# Patient Record
Sex: Female | Born: 1955 | Race: Black or African American | Hispanic: No | Marital: Single | Smoking: Never smoker
Health system: Southern US, Community
[De-identification: ages and names within clinical notes are randomized; demographics above are authoritative.]

## PROBLEM LIST (undated history)

## (undated) DIAGNOSIS — R011 Cardiac murmur, unspecified: Secondary | ICD-10-CM

## (undated) DIAGNOSIS — I10 Essential (primary) hypertension: Secondary | ICD-10-CM

## (undated) DIAGNOSIS — M1990 Unspecified osteoarthritis, unspecified site: Secondary | ICD-10-CM

## (undated) DIAGNOSIS — J302 Other seasonal allergic rhinitis: Secondary | ICD-10-CM

## (undated) DIAGNOSIS — R7611 Nonspecific reaction to tuberculin skin test without active tuberculosis: Secondary | ICD-10-CM

## (undated) DIAGNOSIS — G4730 Sleep apnea, unspecified: Secondary | ICD-10-CM

## (undated) DIAGNOSIS — R7303 Prediabetes: Secondary | ICD-10-CM

## (undated) DIAGNOSIS — E669 Obesity, unspecified: Secondary | ICD-10-CM

## (undated) HISTORY — PX: KNEE ARTHROSCOPY: SUR90

## (undated) HISTORY — PX: SHOULDER ARTHROSCOPY: SHX128

## (undated) HISTORY — PX: CARDIAC CATHETERIZATION: SHX172

## (undated) HISTORY — DX: Nonspecific reaction to tuberculin skin test without active tuberculosis: R76.11

## (undated) HISTORY — PX: COLONOSCOPY: SHX174

## (undated) HISTORY — DX: Obesity, unspecified: E66.9

---

## 1997-02-01 HISTORY — PX: ABDOMINAL HYSTERECTOMY: SHX81

## 1997-09-24 ENCOUNTER — Ambulatory Visit: Admission: RE | Admit: 1997-09-24 | Discharge: 1997-09-24 | Payer: Self-pay | Admitting: General Surgery

## 1997-09-27 ENCOUNTER — Encounter: Payer: Self-pay | Admitting: General Surgery

## 1997-11-03 ENCOUNTER — Ambulatory Visit (HOSPITAL_COMMUNITY): Admission: RE | Admit: 1997-11-03 | Discharge: 1997-11-03 | Payer: Self-pay | Admitting: *Deleted

## 1998-01-26 ENCOUNTER — Encounter: Payer: Self-pay | Admitting: Emergency Medicine

## 1998-01-26 ENCOUNTER — Emergency Department (HOSPITAL_COMMUNITY): Admission: EM | Admit: 1998-01-26 | Discharge: 1998-01-26 | Payer: Self-pay | Admitting: Emergency Medicine

## 2000-04-15 ENCOUNTER — Emergency Department (HOSPITAL_COMMUNITY): Admission: EM | Admit: 2000-04-15 | Discharge: 2000-04-15 | Payer: Self-pay | Admitting: Emergency Medicine

## 2000-04-15 ENCOUNTER — Encounter: Payer: Self-pay | Admitting: Emergency Medicine

## 2000-05-09 ENCOUNTER — Ambulatory Visit (HOSPITAL_BASED_OUTPATIENT_CLINIC_OR_DEPARTMENT_OTHER): Admission: RE | Admit: 2000-05-09 | Discharge: 2000-05-10 | Payer: Self-pay | Admitting: Orthopedic Surgery

## 2000-12-28 ENCOUNTER — Encounter: Admission: RE | Admit: 2000-12-28 | Discharge: 2001-03-28 | Payer: Self-pay | Admitting: Pediatrics

## 2003-02-02 LAB — HM COLONOSCOPY

## 2003-12-10 ENCOUNTER — Other Ambulatory Visit: Admission: RE | Admit: 2003-12-10 | Discharge: 2003-12-10 | Payer: Self-pay | Admitting: Obstetrics and Gynecology

## 2003-12-31 ENCOUNTER — Ambulatory Visit (HOSPITAL_COMMUNITY): Admission: RE | Admit: 2003-12-31 | Discharge: 2003-12-31 | Payer: Self-pay | Admitting: Obstetrics and Gynecology

## 2004-01-07 ENCOUNTER — Encounter: Admission: RE | Admit: 2004-01-07 | Discharge: 2004-01-07 | Payer: Self-pay | Admitting: Obstetrics and Gynecology

## 2005-02-24 ENCOUNTER — Emergency Department (HOSPITAL_COMMUNITY): Admission: EM | Admit: 2005-02-24 | Discharge: 2005-02-24 | Payer: Self-pay | Admitting: Emergency Medicine

## 2005-04-01 ENCOUNTER — Encounter: Admission: RE | Admit: 2005-04-01 | Discharge: 2005-06-30 | Payer: Self-pay | Admitting: Orthopedic Surgery

## 2005-06-02 ENCOUNTER — Encounter: Payer: Self-pay | Admitting: *Deleted

## 2005-06-03 ENCOUNTER — Ambulatory Visit (HOSPITAL_COMMUNITY): Admission: RE | Admit: 2005-06-03 | Discharge: 2005-06-03 | Payer: Self-pay | Admitting: Cardiology

## 2006-05-05 ENCOUNTER — Inpatient Hospital Stay (HOSPITAL_COMMUNITY): Admission: RE | Admit: 2006-05-05 | Discharge: 2006-05-09 | Payer: Self-pay | Admitting: Orthopedic Surgery

## 2006-05-30 ENCOUNTER — Encounter: Admission: RE | Admit: 2006-05-30 | Discharge: 2006-08-09 | Payer: Self-pay | Admitting: Orthopedic Surgery

## 2006-07-13 ENCOUNTER — Encounter: Admission: RE | Admit: 2006-07-13 | Discharge: 2006-07-13 | Payer: Self-pay | Admitting: Cardiology

## 2006-08-26 ENCOUNTER — Ambulatory Visit (HOSPITAL_BASED_OUTPATIENT_CLINIC_OR_DEPARTMENT_OTHER): Admission: RE | Admit: 2006-08-26 | Discharge: 2006-08-26 | Payer: Self-pay | Admitting: Cardiology

## 2006-08-28 ENCOUNTER — Ambulatory Visit: Payer: Self-pay | Admitting: Internal Medicine

## 2006-11-09 ENCOUNTER — Encounter: Admission: RE | Admit: 2006-11-09 | Discharge: 2006-11-09 | Payer: Self-pay | Admitting: Gastroenterology

## 2007-09-21 ENCOUNTER — Ambulatory Visit (HOSPITAL_COMMUNITY): Admission: RE | Admit: 2007-09-21 | Discharge: 2007-09-21 | Payer: Self-pay | Admitting: Cardiology

## 2007-11-15 ENCOUNTER — Emergency Department (HOSPITAL_COMMUNITY): Admission: EM | Admit: 2007-11-15 | Discharge: 2007-11-15 | Payer: Self-pay | Admitting: Emergency Medicine

## 2009-02-04 ENCOUNTER — Ambulatory Visit (HOSPITAL_COMMUNITY): Admission: RE | Admit: 2009-02-04 | Discharge: 2009-02-04 | Payer: Self-pay | Admitting: Cardiology

## 2010-06-16 NOTE — Procedures (Signed)
NAME:  Katelyn [name], Katelyn [name]            ACCOUNT NO.:  [id number]   MEDICAL RECORD NO.:  [id number]          PATIENT TYPE:  OUT   LOCATION:  SLEEP CENTER                 FACILITY:  [facility]   PHYSICIAN:  Clinton D. Maple Hudson, MD, FCCP, FACPDATE OF BIRTH:  03-18-54   DATE OF STUDY:  08/26/2006                            NOCTURNAL POLYSOMNOGRAM   REFERRING PHYSICIAN:   REFERRING PHYSICIAN:  Osvaldo Shipper. Spruill MD   INDICATION FOR STUDY:  Hypersomnia with sleep apnea.   EPWORTH SLEEPINESS SCORE:  14/24   BMI 36.8, weight 216 pounds.   HOME MEDICATION:  Listed and reviewed.   SLEEP ARCHITECTURE:  Total sleep time 310 minutes with sleep efficiency  79%.  Stage 1 was 16%; stage 2, 61%; stage 3 absent; REM 22% of total  sleep time.  Sleep latency 22 minutes, REM latency 92 minutes, awake  after sleep onset 63 minutes, arousal index 11.2.  No bedtime medication  was taken.   RESPIRATORY DATA:  Apnea/hypopnea index (AHI, RDI) 15.7 obstructive  events per hour, indicating mild to moderate obstructive sleep  apnea/hypopnea syndrome.  All events were hypopneas with a total of 81  recorded.  Events were not positional.  REM AHI 60 per hour.  There were  insufficient events to  meet protocol criteria for split CPAP titration  on this study night.   OXYGEN DATA:  Moderate snoring with oxygen desaturation to a nadir of  79%.  Mean oxygen saturation through the study was 94% on room air.   CARDIAC DATA:  Sinus rhythm with occasional PVC.   MOVEMENT/PARASOMNIA:  Rare limb jerk, insignificant.  Bathroom x1.   IMPRESSION/RECOMMENDATION:  1. Mild to moderate obstructive sleep apnea/hypopnea syndrome,      apnea/hypopnea index 15.7 per hour.  Events were all hypopneas,      nonpositional.  Moderate to loud snoring with oxygen desaturation      to a nadir of 79%.  2. There were insufficient early events to permit CPAP titration by      split protocol on this study night.  The patient would be a  candidate to return for CPAP titration.  Otherwise, consider for      alternative therapies as appropriate.      Clinton D. Maple Hudson, MD, [facility], FACP  Diplomate, Biomedical engineer of Sleep Medicine  Electronically Signed     CDY/MEDQ  D:  08/28/2006 12:26:38  T:  08/29/2006 08:57:48  Job:  [id number]

## 2010-06-19 NOTE — Discharge Summary (Signed)
[name], [name]            ACCOUNT NO.:  [id number]   MEDICAL RECORD NO.:  [id number]          PATIENT TYPE:  INP   LOCATION:  1507                         FACILITY:  [facility]   PHYSICIAN:  John L. Rendall, M.D.  DATE OF BIRTH:  1955-05-05   DATE OF ADMISSION:  05/05/2006  DATE OF DISCHARGE:  05/09/2006                               DISCHARGE SUMMARY   ADMISSION DIAGNOSIS:  Osteoarthritis of the right knee.   DISCHARGE DIAGNOSES:  1. Osteoarthritis of the right knee.  2. Osteoarthritis of the left knee.  3. History of hypertension.  4. Hypokalemia.  5. Exogenous obesity.   PROCEDURE:  Right total knee arthroplasty.   HISTORY:  Katelyn [name] is a very pleasant 55 year old African American  female with a history of osteoarthritis of both knees, right greater  than left.  Right knee has constant moderate to severe sharp, stabbing,  and aching pain.  Her symptoms are worsening right now where she is  having difficulties with her activities of daily living.  Radiographically, the patient has medial joint space narrowing.  She is  now indicated for right total knee arthroplasty.   HOSPITAL COURSE:  A 55 year old African American female admitted May 05, 2006. After appropriate laboratory studies were obtained, she was  taken to the operating room, where she underwent a right LCS total knee  arthroplasty with computer navigation by Dr. Priscille Kluver, assisted by Legrand Pitts. Duffy, P.A.-C.  She tolerated the procedure well.  She was placed  preoperatively on vancomycin 1 g IV on call to the operating room and  postoperatively was placed on clindamycin 600 mg IV q.8 h. x3 doses.  She was begun on Arixtra 2.5 mg subcutaneously q.8 p.m. for 7 days.  She  has a consultation with PT, OT, and care management.  Allowed to be  weightbearing as tolerated on the right.  A Foley was placed  intraoperatively.  CPM 0-90 degrees for 6-8 hours per day.  She was  allowed out of bed to a chair the  following day.  Her Foley was  discontinued on May 06, 2006.  Home health PT, CPM, and rolling walker  were ordered.  Her dressing was changed on postop day 2, and the wound  remained benign throughout the hospital course.  Her IV was discontinued  on May 07, 2006.  On May 08, 2006, she did have a Dulcolax  suppository.  Her potassium went down to 3, and she was placed on K-Dur  40 mg p.o. b.i.d. for one day.  Repeat potassium on May 09, 2006  revealed 2.9.  At that time, she was ordered 40 mEq p.o. b.i.d. today,  on the day of her discharge.  Potassium will be drawn at 1 p.m.  If it  is improving, then we will discharge.   EKG from November 02, 2005 from a physician's office with Dr. Shana Chute  appeared to be normal sinus rhythm.   RADIOGRAPHIC STUDIES:  Chest x-ray of May 02, 2006 revealed mild  thoracic spondylosis and no acute findings.   LABORATORY STUDIES:  Admitted with hemoglobin 12.5, hematocrit 38%,  white  count 8900, and platelets 318,000.  Discharge hemoglobin 9.9,  hematocrit 28.6%, white count 14,000, and platelet count 262,000.  White  count was elevated at this maximum of 15,300 on May 07, 2006.  Chemistries:  Admitted with sodium 144, potassium 3.6, chloride 102, CO2  33, glucose 129, BUN 14, creatinine 1.02.  GFR was 57.  Total protein  7.4, albumin 3.2, AST 19, ALT 18, ALP 70, total bilirubin 1.1.  Discharge potassium was 2.9 at the time of this dictation.  Previously,  sodium was 136, potassium 3, chloride 99, CO2 30, glucose 167, BUN 8,  creatinine 0.94.  Urinalysis on May 02, 2006 revealed many  epithelials, 0-2 white cells, few bacteria, and hyaline casts were  noted.  Urine on May 05, 2006 was benign.  Urine culture on May 02, 2006 showed 50,000 colonies of Escherichia coli which was sensitive to  ceftriaxone, ciprofloxacin, gentamycin, levofloxacin, nitrofurantoin,  tobramycin, and Septra.  However, her catheterized urine on May 05, 2006, on the day  of her admission, revealed no bacteria.   DISCHARGE INSTRUCTIONS:  There are no restrictions on her diet, other  than to eat a banana a day.  She will increase her activity slowly.  Use  of crutches or a walker, weightbearing on the right as tolerated.  She  may bathe or shower.  No lifting for 6 weeks, no driving for 6 weeks.  CPM 0-90 degrees for 6-8 hours per day.  She will follow up per  instruction sheet.   PRESCRIPTIONS:  1. Arixtra 2.5 mg, inject 2.5 subcutaneously at 8 p.m., last dose on      May 11, 2006.  2. Percocet 5/325, 1-2 tabs q.4-6 h. as needed for pain.  3. Robaxin 500 mg 1 tab q.6 h. as needed for spasms.  4. Allegra 180 mg 1 tablet daily.  5. Diovan/HCTZ 160/25, 1 tab daily.  6. Multivitamin take 1 tab daily.  7. Aspirin 81 mg 1 tab daily starting May 12, 2006.  8. Mucinex take 1 tab b.i.d.  9. Robitussin-DM 1 tsp q.4 h. as needed.  10.She will stop her tramadol.   [facility] for her home health needs.  Return to see Dr. Priscille Kluver on  Thursday May 19, 2006.  Call for an appointment.  Discharged in  improved condition.      Oris Drone Petrarca, P.A.-C.      Carlisle Beers. Rendall, M.D.  Electronically Signed    BDP/MEDQ  D:  05/09/2006  T:  05/09/2006  Job:  [id number]

## 2010-06-19 NOTE — Op Note (Signed)
Katelyn [name], Katelyn [name]            ACCOUNT NO.:  [id number]   MEDICAL RECORD NO.:  [id number]          PATIENT TYPE:  INP   LOCATION:  NA                           FACILITY:  [facility]   PHYSICIAN:  John L. Rendall, M.D.  DATE OF BIRTH:  30-Apr-1955   DATE OF PROCEDURE:  05/05/2006  DATE OF DISCHARGE:                               OPERATIVE REPORT   PREOPERATIVE DIAGNOSIS:  Osteoarthritis with deformity, right knee.   SURGICAL PROCEDURE:  Right LCS total knee arthroplasty with computer  navigation assistance.   POSTOPERATIVE DIAGNOSIS:  Osteoarthritis with deformity, right knee.   SURGEON:  John L. Rendall, M.D.   ASSISTANT:  Legrand Pitts. Duffy, P.A.-C.   ANESTHESIA:  General.   PATHOLOGY:  The patient has a fixed varus deformity of 5.5 degrees with  flexion of 8 degrees at the start of the procedure according to the  computer.  She is 24, and anatomic alignment is strongly to be desired  in view of her young age and need to give longevity to the prosthesis.   PROCEDURE:  Under general anesthesia, the right leg is prepared with  DuraPrep.  A sterile tourniquet is applied.  The leg is wrapped out, and  the tourniquet is used at 350 mm.   A midline incision is made.  The femur is sized at a standard.  Debridement is carried out in preparation for computer mapping.  Two  Schanz pins are then placed through extra stab wounds in the proximal  medial tibia and then the distal medial femur.  The arrays are set up.  The femoral head is identified.  The medial and lateral malleoli are  identified.  The proximal tibia and distal femur are mapped.  Knee  extension is checked, and the deformity noted above is found.  At this  point, the first guide is used for proximal tibial resection 10 mm off  the high side.  The tensioner is then inserted, and the knee is still in  7 degrees of varus, and extensive release along the medial tibia is  done, taking down spurs.  Once this is completed, it is  down to 3.5  degrees of varus, and it is necessary to release the semimembranosus and  the posterior medial corner to get full extension to 0 degrees.  Once  this is done, with the tensioner in extension and flexion is checked, we  gone ahead and used the computer for the first cut on the femur,  anterior and posterior flare within 1 degree of anatomic alignment, and  then the distal femoral cut is made and the flexion and extension gaps  are balanced at 10 mm.  The laminar spreader is then inserted.  Remnants  of the menisci and spurs off the back of the femoral condyles are  removed.  Recessing guide is then inserted, and chamfer cuts are made.  Once this was completed, the proximal tibia is exposed.  Center peg hole  with keel is inserted.  Trial seating then of a 2.5 tibia, 10-mm  bearing, and standard femur reveal excellent fit, alignment, and  stability at 0 degrees  of varus at this point and full extension.  The  patella is then osteotomized and the three peg patella inserted.  Again,  with this in place we are anatomically aligned, and the computer  navigation arrays are taken down.  Permanent components are obtained.  They are then cemented in place after appropriate preparation of bony  surfaces with pulse irrigation.  Once the cement is hardened, the  tourniquet is let down at 1 hour and 12 minutes.  Multiple small vessels  are cauterized.  The knee is then closed in layers with #1 Tycron, 0  Vicryl, 2-0 Vicryl, and skin clips.  Operative time was approximately an  hour and a half.   The patient tolerated the procedure well and has an excellent stable  knee.  She returned to recovery in good condition.  Lucienne Minks Duffy P.A.-C.  was there throughout the entire case and participated in all parts of  the case.      John L. Rendall, M.D.  Electronically Signed     JLR/MEDQ  D:  05/05/2006  T:  05/05/2006  Job:  [id number]

## 2010-06-19 NOTE — Op Note (Signed)
[place]  Patient:    Katelyn [name], Katelyn [name]                   MRN: [id number] Proc. Date: 05/09/00 Adm. Date:  [id number] Attending:  Carolan Shiver Ii                           Operative Report  PREOPERATIVE DIAGNOSES: 1. Torn medial and lateral menisci, right knee. 2. Internal derangement with osteoarthritis, left knee.  PROCEDURES: 1. Right knee medial and lateral meniscectomy. 2. Patellofemoral chondroplasty, right knee. 3. Left knee medial meniscectomy. 4. Patellofemoral chondroplasty, left knee.  POSTOPERATIVE DIAGNOSES: 1. Degenerative tears medial and lateral menisci, right knee, with    patellofemoral arthritis. 2. Left knee degenerative tear medial meniscus, with patellofemoral arthritis.  SURGEON:  John L. Dorothyann Gibbs, M.D.  ANESTHESIA: General.  INDICATION AND JUSTIFICATION FOR PROCEDURE:  Chronic bilateral knee pain with MRI positive for torn medial and lateral menisci in the right knee two years ago and now with worsening symptoms in the left knee as well.  JUSTIFICATION FOR OVERNIGHT STAY:  Pain control and the fact that it is a bilateral procedure.  DESCRIPTION OF PROCEDURE:  Under general anesthesia, both legs were prepared with Duraprep and draped as a sterile field using leg holders and tourniquets proximal to the leg holders.  Right leg was wrapped out with an Esmarch first. Standard portals were used.  Diagnostic arthroscopy revealed complex posterior horn tear medial meniscus and grade 2 and 3 changes on the articular cartilage of the femoral condyle.  Using a basket forceps and a curved Merlin shaver, the meniscus was resected back to stable meniscal rim, and chondroplasty of the femoral condyle was performed.  Attention was then turned to the patellofemoral articulation, which through the whole patellofemoral groove shows peeling hyaline cartilage and rough edges and craters and fibrillation on the whole  inferior one-half of the patella.  Using curved Merlin shaver through both medial and lateral portals, patellar shaving is carried out. Attention is then turned to the lateral meniscus.  In the posterior horn near the intercondylar notch, there was a complex tear of the lateral meniscus. This was resected with a combination of basket forceps and intra-articular shaver, again through both medial and lateral portals for optimal resection. Once this was all completed, the knee was copiously irrigated with saline.  A minor synovitis was resected in the suprapatellar pouch.  The knee has one portal closed with 3-0 nylon, and it has been injected with Marcaine with epinephrine.  Attention is then turned to the left knee.  Right knee tourniquet time was 35 minutes.  The left knee is then wrapped out with an Esmarch, tourniquet used at 350 mm.  Standard portals are used.  A complex posterior horn tear of the medial meniscus is found, again with peeling cartilage off the femoral condyle.  Using a combination of basket forceps and intra-articular shaver, this was resected back to stable meniscal rim, and local chondroplasty is done.  Attention is then turned to the patella, where the entire center of the patellofemoral groove is peeling, and this is shaved with an intra-articular shaver through both medial and lateral portals for optimal smoothing.  The patella likewise is shaved on its inferior pole.  Once this is completed, attention is turned to the lateral meniscus, where there is only a minor abnormality of the anterior horn requiring minor shaving.  Once this  is completed, the knee is copiously irrigated with saline, infiltrated with Marcaine with morphine with epinephrine, and a sterile compression bandage is applied.  Tourniquet time left knee was 16 minutes. DD:  05/09/00 TD:  05/09/00 Job: [id number]

## 2010-06-25 ENCOUNTER — Other Ambulatory Visit (HOSPITAL_COMMUNITY): Payer: Self-pay | Admitting: Cardiology

## 2010-06-25 DIAGNOSIS — Z1231 Encounter for screening mammogram for malignant neoplasm of breast: Secondary | ICD-10-CM

## 2010-07-14 ENCOUNTER — Ambulatory Visit (HOSPITAL_COMMUNITY)
Admission: RE | Admit: 2010-07-14 | Discharge: 2010-07-14 | Disposition: A | Discharge status: Home / Self Care | Payer: BC Managed Care – PPO | Source: Ambulatory Visit | Attending: Cardiology | Admitting: Cardiology

## 2010-07-14 DIAGNOSIS — Z1231 Encounter for screening mammogram for malignant neoplasm of breast: Secondary | ICD-10-CM | POA: Insufficient documentation

## 2010-07-17 ENCOUNTER — Other Ambulatory Visit: Payer: Self-pay | Admitting: Cardiology

## 2010-07-17 DIAGNOSIS — R928 Other abnormal and inconclusive findings on diagnostic imaging of breast: Secondary | ICD-10-CM

## 2010-07-23 ENCOUNTER — Other Ambulatory Visit: Payer: BC Managed Care – PPO

## 2010-07-28 ENCOUNTER — Ambulatory Visit
Admission: RE | Admit: 2010-07-28 | Discharge: 2010-07-28 | Disposition: A | Discharge status: Home / Self Care | Payer: BC Managed Care – PPO | Source: Ambulatory Visit | Attending: Cardiology | Admitting: Cardiology

## 2010-07-28 DIAGNOSIS — R928 Other abnormal and inconclusive findings on diagnostic imaging of breast: Secondary | ICD-10-CM

## 2011-06-08 ENCOUNTER — Other Ambulatory Visit: Payer: Self-pay | Admitting: Cardiology

## 2011-09-21 ENCOUNTER — Other Ambulatory Visit (HOSPITAL_COMMUNITY): Payer: Self-pay | Admitting: Cardiology

## 2011-09-21 DIAGNOSIS — Z1231 Encounter for screening mammogram for malignant neoplasm of breast: Secondary | ICD-10-CM

## 2011-10-12 ENCOUNTER — Ambulatory Visit (HOSPITAL_COMMUNITY)
Admission: RE | Admit: 2011-10-12 | Discharge: 2011-10-12 | Disposition: A | Discharge status: Home / Self Care | Payer: 59 | Source: Ambulatory Visit | Attending: Cardiology | Admitting: Cardiology

## 2011-10-12 DIAGNOSIS — Z1231 Encounter for screening mammogram for malignant neoplasm of breast: Secondary | ICD-10-CM

## 2011-11-08 ENCOUNTER — Other Ambulatory Visit (HOSPITAL_COMMUNITY): Payer: Self-pay | Admitting: Orthopedic Surgery

## 2011-11-08 DIAGNOSIS — M25561 Pain in right knee: Secondary | ICD-10-CM

## 2011-11-11 ENCOUNTER — Encounter (HOSPITAL_COMMUNITY): Payer: Self-pay

## 2011-11-11 ENCOUNTER — Encounter (HOSPITAL_COMMUNITY)
Admission: RE | Admit: 2011-11-11 | Discharge: 2011-11-11 | Disposition: A | Discharge status: Home / Self Care | Payer: 59 | Source: Ambulatory Visit | Attending: Orthopedic Surgery | Admitting: Orthopedic Surgery

## 2011-11-11 DIAGNOSIS — M25561 Pain in right knee: Secondary | ICD-10-CM

## 2011-11-11 DIAGNOSIS — M25569 Pain in unspecified knee: Secondary | ICD-10-CM | POA: Insufficient documentation

## 2011-11-11 MED ORDER — TECHNETIUM TC 99M MEDRONATE IV KIT
25.0000 | PACK | Freq: Once | INTRAVENOUS | Status: AC | PRN
  Administered 2011-11-11: 25 via INTRAVENOUS

## 2011-11-25 ENCOUNTER — Other Ambulatory Visit (HOSPITAL_COMMUNITY): Payer: Self-pay | Admitting: Cardiology

## 2011-11-25 DIAGNOSIS — R079 Chest pain, unspecified: Secondary | ICD-10-CM

## 2011-12-06 ENCOUNTER — Encounter (HOSPITAL_COMMUNITY)
Admission: RE | Admit: 2011-12-06 | Discharge: 2011-12-06 | Disposition: A | Discharge status: Home / Self Care | Payer: 59 | Source: Ambulatory Visit | Attending: Cardiology | Admitting: Cardiology

## 2011-12-06 ENCOUNTER — Other Ambulatory Visit: Payer: Self-pay

## 2011-12-06 DIAGNOSIS — R079 Chest pain, unspecified: Secondary | ICD-10-CM | POA: Insufficient documentation

## 2011-12-06 LAB — LIPID PANEL
Cholesterol: 181 mg/dL (ref 0–200)
LDL Cholesterol: 102 mg/dL — ABNORMAL HIGH (ref 0–99)
Triglycerides: 80 mg/dL (ref <150)
VLDL: 16 mg/dL (ref 0–40)

## 2011-12-06 LAB — HEPATIC FUNCTION PANEL
Alkaline Phosphatase: 73 U/L (ref 39–117)
Bilirubin, Direct: 0.1 mg/dL (ref 0.0–0.3)
Indirect Bilirubin: 0.6 mg/dL (ref 0.3–0.9)
Total Protein: 7.8 g/dL (ref 6.0–8.3)

## 2011-12-06 LAB — BASIC METABOLIC PANEL: Glucose, Bld: 127 mg/dL — ABNORMAL HIGH (ref 70–99)

## 2011-12-06 LAB — HEMOGLOBIN A1C: Hgb A1c MFr Bld: 7 % — ABNORMAL HIGH (ref <5.7)

## 2011-12-06 MED ORDER — REGADENOSON 0.4 MG/5ML IV SOLN
0.4000 mg | Freq: Once | INTRAVENOUS | Status: AC
  Administered 2011-12-06: 0.4 mg via INTRAVENOUS

## 2011-12-06 MED ORDER — REGADENOSON 0.4 MG/5ML IV SOLN
INTRAVENOUS | Status: AC
  Filled 2011-12-06: qty 5

## 2011-12-06 MED ORDER — TECHNETIUM TC 99M SESTAMIBI GENERIC - CARDIOLITE
10.0000 | Freq: Once | INTRAVENOUS | Status: AC | PRN
  Administered 2011-12-06: 10 via INTRAVENOUS

## 2011-12-06 MED ORDER — TECHNETIUM TC 99M SESTAMIBI GENERIC - CARDIOLITE
30.0000 | Freq: Once | INTRAVENOUS | Status: AC | PRN
  Administered 2011-12-06: 30 via INTRAVENOUS

## 2011-12-14 ENCOUNTER — Encounter (HOSPITAL_BASED_OUTPATIENT_CLINIC_OR_DEPARTMENT_OTHER)
Admission: RE | Disposition: A | Discharge status: Home / Self Care | Payer: Self-pay | Source: Ambulatory Visit | Attending: Cardiology

## 2011-12-14 ENCOUNTER — Inpatient Hospital Stay (HOSPITAL_BASED_OUTPATIENT_CLINIC_OR_DEPARTMENT_OTHER)
Admission: RE | Admit: 2011-12-14 | Discharge: 2011-12-14 | Disposition: A | Discharge status: Home / Self Care | Payer: 59 | Source: Ambulatory Visit | Attending: Cardiology | Admitting: Cardiology

## 2011-12-14 DIAGNOSIS — R0609 Other forms of dyspnea: Secondary | ICD-10-CM | POA: Insufficient documentation

## 2011-12-14 DIAGNOSIS — E7800 Pure hypercholesterolemia, unspecified: Secondary | ICD-10-CM | POA: Insufficient documentation

## 2011-12-14 DIAGNOSIS — G4733 Obstructive sleep apnea (adult) (pediatric): Secondary | ICD-10-CM | POA: Insufficient documentation

## 2011-12-14 DIAGNOSIS — R9439 Abnormal result of other cardiovascular function study: Secondary | ICD-10-CM | POA: Insufficient documentation

## 2011-12-14 DIAGNOSIS — R0989 Other specified symptoms and signs involving the circulatory and respiratory systems: Secondary | ICD-10-CM | POA: Insufficient documentation

## 2011-12-14 DIAGNOSIS — M1990 Unspecified osteoarthritis, unspecified site: Secondary | ICD-10-CM | POA: Insufficient documentation

## 2011-12-14 DIAGNOSIS — I10 Essential (primary) hypertension: Secondary | ICD-10-CM | POA: Insufficient documentation

## 2011-12-14 DIAGNOSIS — E662 Morbid (severe) obesity with alveolar hypoventilation: Secondary | ICD-10-CM | POA: Insufficient documentation

## 2011-12-14 SURGERY — JV LEFT HEART CATHETERIZATION WITH CORONARY ANGIOGRAM
Anesthesia: Moderate Sedation

## 2011-12-14 MED ORDER — SODIUM CHLORIDE 0.9 % IV SOLN: INTRAVENOUS | Status: DC

## 2011-12-14 MED ORDER — ACETAMINOPHEN 325 MG PO TABS: 650.0000 mg | ORAL_TABLET | ORAL | Status: DC | PRN

## 2011-12-14 NOTE — OR Nursing (Signed)
Tegaderm dressing applied, site level 0, bedrest begins at 1030 

## 2011-12-14 NOTE — OR Nursing (Signed)
Discharge instructions reviewed and signed, pt stated understanding, ambulated in hall without difficulty, site level 0, transported to friend's car via wheelchair 

## 2011-12-14 NOTE — OR Nursing (Signed)
Meal served 

## 2011-12-14 NOTE — CV Procedure (Signed)
Cardiac cath report dictated on 12/14/2011 dictation number is [phone]

## 2011-12-14 NOTE — H&P (Signed)
  Handwritten H&P in the chart needs to be scanned. 

## 2011-12-15 NOTE — Cardiovascular Report (Signed)
NAMEYANAE, CRISSMAN            ACCOUNT NO.:  [id number]  MEDICAL RECORD NO.:  [id number]  LOCATION:                                 FACILITY:  PHYSICIAN:  Irisha Grandmaison N. Sharyn Lull, M.D. DATE OF BIRTH:  11-15-1955  DATE OF PROCEDURE:  12/14/2011 DATE OF DISCHARGE:                           CARDIAC CATHETERIZATION   PROCEDURE:  Left cardiac cath with selective left and right coronary angiography, LV graphy via right groin using Judkins technique.  INDICATION FOR THE PROCEDURE:  Ms. Johanns is 56 year old black female with past medical history significant for hypertension, glucose intolerance, obstructive sleep apnea/obesity hypoventilation syndrome, hypercholesteremia, morbid obesity, degenerative joint disease, complaints of exertional dyspnea associated with minimal exertion.  The patient denies any chest pain, nausea, vomiting, diaphoresis, although her activity is very limited due to knee problems and is scheduled for right total knee replacement.  The patient underwent Lexiscan Myoview on December 06, 2011 which showed small area of reversible ischemia in the anteroapical region with EF of 77%.  The patient denies any palpitation, lightheadedness, syncope.  Discussed with the patient regarding mildly abnormal Lexiscan Myoview and various options of treatment, i.e., medical versus invasive left cath, its risks and benefits, i.e., death, MI, stroke, need for emergency CABG, local vascular complications, etc., and consented for PCI procedure.  After obtaining the informed consent, the patient was brought to the cath lab and was placed on fluoroscopy table.  Right groin was prepped and draped in usual fashion.  Xylocaine 1% was used for local anesthesia in the right groin.  With the help of thin wall needle, a 4-French arterial sheath was placed.  The sheath was aspirated and flushed.  Next, 4-French left Judkins catheter was advanced over the wire under fluoroscopic guidance up to the  ascending aorta.  Wire was pulled out.  The catheter was aspirated and connected to the Manifold.  Catheter was further advanced and engaged into left coronary ostium.  Multiple views of the left system were taken.  Next, catheter was disengaged and was pulled out over the wire and was replaced with 4-French 3D right diagnostic catheter which was advanced over the wire under fluoroscopic guidance up to the ascending aorta. Wire was pulled out.  The catheter was aspirated and connected to the Manifold.  Catheter was further advanced and engaged into right coronary ostium.  Multiple views of the right system were taken.  Next, catheter was disengaged and was pulled out over the wire and was replaced with 4- [place] pigtail catheter which was advanced over the wire under fluoroscopic guidance up to the ascending aorta.  Wire was pulled out. The catheter was aspirated and connected to the Manifold.  Catheter was further advanced across the aortic valve into the LV.  LV pressures were recorded.  Next, LV graft was done in 30-degree RAO position. Postangiographic pressures were recorded from LV and then pullback pressures were recorded from the aorta.  There was no gradient across the aortic valve.  Next, the pigtail catheter was pulled out over the wire.  Sheaths were aspirated and flushed.  FINDINGS:  LV showed good LV systolic function.  Mild LVH.  Left main was short which was patent.  LAD has  10-15% proximal and mid stenosis. Diagonal 1 and 2 were very small which were patent.  Ramus was large which was patent.  Left circumflex was small which was patent.  RCA was patent.  PDA and PLV branches were small which were patent.  The patient tolerated procedure well.  There were no complications.  The patient was transferred to recovery room in stable condition.     Eduardo Osier. Sharyn Lull, M.D.     MNH/MEDQ  D:  12/14/2011  T:  12/15/2011  Job:  [id number]

## 2011-12-28 ENCOUNTER — Other Ambulatory Visit: Payer: Self-pay | Admitting: Orthopedic Surgery

## 2012-01-17 ENCOUNTER — Encounter (HOSPITAL_COMMUNITY): Admission: RE | Admit: 2012-01-17 | Payer: 59 | Source: Ambulatory Visit

## 2012-02-21 ENCOUNTER — Encounter (HOSPITAL_COMMUNITY): Payer: Self-pay | Admitting: Pharmacy Technician

## 2012-02-25 ENCOUNTER — Encounter (HOSPITAL_COMMUNITY): Payer: Self-pay

## 2012-02-25 ENCOUNTER — Other Ambulatory Visit: Payer: Self-pay | Admitting: Orthopedic Surgery

## 2012-02-25 ENCOUNTER — Encounter (HOSPITAL_COMMUNITY)
Admission: RE | Admit: 2012-02-25 | Discharge: 2012-02-25 | Disposition: A | Discharge status: Home / Self Care | Payer: 59 | Source: Ambulatory Visit | Attending: Orthopedic Surgery | Admitting: Orthopedic Surgery

## 2012-02-25 HISTORY — DX: Other seasonal allergic rhinitis: J30.2

## 2012-02-25 HISTORY — DX: Essential (primary) hypertension: I10

## 2012-02-25 HISTORY — DX: Cardiac murmur, unspecified: R01.1

## 2012-02-25 HISTORY — DX: Sleep apnea, unspecified: G47.30

## 2012-02-25 LAB — URINALYSIS, ROUTINE W REFLEX MICROSCOPIC
Bilirubin Urine: NEGATIVE
Nitrite: NEGATIVE
Specific Gravity, Urine: 1.022 (ref 1.005–1.030)
Urobilinogen, UA: 1 mg/dL (ref 0.0–1.0)
pH: 6.5 (ref 5.0–8.0)

## 2012-02-25 LAB — TYPE AND SCREEN

## 2012-02-25 LAB — PROTIME-INR
INR: 0.97 (ref 0.00–1.49)
Prothrombin Time: 12.8 seconds (ref 11.6–15.2)

## 2012-02-25 LAB — COMPREHENSIVE METABOLIC PANEL
AST: 15 U/L (ref 0–37)
Albumin: 3.1 g/dL — ABNORMAL LOW (ref 3.5–5.2)
BUN: 21 mg/dL (ref 6–23)
Chloride: 100 mEq/L (ref 96–112)
Creatinine, Ser: 1.01 mg/dL (ref 0.50–1.10)
Total Bilirubin: 0.5 mg/dL (ref 0.3–1.2)
Total Protein: 7.6 g/dL (ref 6.0–8.3)

## 2012-02-25 LAB — CBC WITH DIFFERENTIAL/PLATELET
Basophils Absolute: 0 10*3/uL (ref 0.0–0.1)
Basophils Relative: 0 % (ref 0–1)
Eosinophils Absolute: 0.2 10*3/uL (ref 0.0–0.7)
HCT: 39.2 % (ref 36.0–46.0)
MCH: 26.3 pg (ref 26.0–34.0)
MCHC: 33.7 g/dL (ref 30.0–36.0)
Monocytes Absolute: 0.9 10*3/uL (ref 0.1–1.0)
Neutro Abs: 4.2 10*3/uL (ref 1.7–7.7)
Neutrophils Relative %: 51 % (ref 43–77)
RDW: 15.8 % — ABNORMAL HIGH (ref 11.5–15.5)

## 2012-02-25 LAB — SURGICAL PCR SCREEN
MRSA, PCR: NEGATIVE
Staphylococcus aureus: NEGATIVE

## 2012-02-25 LAB — URINE MICROSCOPIC-ADD ON

## 2012-02-25 LAB — APTT: aPTT: 37 seconds (ref 24–37)

## 2012-02-25 MED ORDER — CHLORHEXIDINE GLUCONATE 4 % EX LIQD: 60.0000 mL | Freq: Once | CUTANEOUS | Status: DC

## 2012-02-28 ENCOUNTER — Encounter (HOSPITAL_COMMUNITY): Payer: Self-pay | Admitting: Vascular Surgery

## 2012-02-28 LAB — URINE CULTURE

## 2012-02-28 NOTE — Consult Note (Signed)
Anesthesia Chart Review:  Patient is a 57 year old female scheduled for right TK revision by Dr. Sherlean Foot on 03/06/12.  History includes non-smoker, morbid obesity (BMI 40), OSA, HTN, TB '97, arthritis, murmur (not specified), right TKA '08.  Patient underwent nuclear stress test on 12/06/11 that showed mild anteroapical ischemia without wall motion abnormality, LVEF 77%.  She subsequently underwent cardiac cath on 12/14/11 (Dr. Sharyn Lull) that showed: LV showed good LV systolic function. Mild LVH. Left main was short which was patent. LAD has 10-15% proximal and mid stenosis. Diagonal 1 and 2 were very small which were patent. Ramus was large which was patent. Left circumflex was small which was patent. RCA was patent. PDA and PLV branches were small which were patent.  EKG on 02/25/12 showed NSR, LAD.    CXR on 02/25/12 showed new cardiomegaly, low volumes with bibasilar subseqmental atelectasis.  Pre-operative labs noted.  Labs faxed to Dr. Tobin Chad office with confirmation.  She had minimal CAD and normal EF by recent cath.  If no significant changes then would anticipate she could proceed from an anesthesia standpoint.  Shonna Chock, PA-C 02/28/12 1759

## 2012-03-03 NOTE — Progress Notes (Signed)
Patient knows to arrive at [address]

## 2012-03-05 MED ORDER — VANCOMYCIN HCL 10 G IV SOLR
1500.0000 mg | INTRAVENOUS | Status: DC
  Filled 2012-03-05: qty 1500

## 2012-03-06 ENCOUNTER — Encounter (HOSPITAL_COMMUNITY): Payer: Self-pay | Admitting: *Deleted

## 2012-03-06 ENCOUNTER — Encounter (HOSPITAL_COMMUNITY): Payer: Self-pay | Admitting: Vascular Surgery

## 2012-03-06 ENCOUNTER — Inpatient Hospital Stay (HOSPITAL_COMMUNITY): Payer: 59

## 2012-03-06 ENCOUNTER — Inpatient Hospital Stay (HOSPITAL_COMMUNITY)
Admission: RE | Admit: 2012-03-06 | Discharge: 2012-03-10 | DRG: 467 | Disposition: A | Discharge status: Skilled Nursing Facility | Payer: 59 | Source: Ambulatory Visit | Attending: Orthopedic Surgery | Admitting: Orthopedic Surgery

## 2012-03-06 ENCOUNTER — Inpatient Hospital Stay (HOSPITAL_COMMUNITY): Payer: 59 | Admitting: Vascular Surgery

## 2012-03-06 ENCOUNTER — Encounter (HOSPITAL_COMMUNITY)
Admission: RE | Disposition: A | Discharge status: Skilled Nursing Facility | Payer: Self-pay | Source: Ambulatory Visit | Attending: Orthopedic Surgery

## 2012-03-06 DIAGNOSIS — Z01818 Encounter for other preprocedural examination: Secondary | ICD-10-CM

## 2012-03-06 DIAGNOSIS — M129 Arthropathy, unspecified: Secondary | ICD-10-CM | POA: Diagnosis present

## 2012-03-06 DIAGNOSIS — Z8611 Personal history of tuberculosis: Secondary | ICD-10-CM

## 2012-03-06 DIAGNOSIS — Y831 Surgical operation with implant of artificial internal device as the cause of abnormal reaction of the patient, or of later complication, without mention of misadventure at the time of the procedure: Secondary | ICD-10-CM | POA: Diagnosis present

## 2012-03-06 DIAGNOSIS — T8489XA Other specified complication of internal orthopedic prosthetic devices, implants and grafts, initial encounter: Principal | ICD-10-CM | POA: Diagnosis present

## 2012-03-06 DIAGNOSIS — J309 Allergic rhinitis, unspecified: Secondary | ICD-10-CM | POA: Diagnosis present

## 2012-03-06 DIAGNOSIS — M25569 Pain in unspecified knee: Secondary | ICD-10-CM

## 2012-03-06 DIAGNOSIS — D62 Acute posthemorrhagic anemia: Secondary | ICD-10-CM | POA: Diagnosis not present

## 2012-03-06 DIAGNOSIS — Z01812 Encounter for preprocedural laboratory examination: Secondary | ICD-10-CM

## 2012-03-06 DIAGNOSIS — I10 Essential (primary) hypertension: Secondary | ICD-10-CM | POA: Diagnosis present

## 2012-03-06 DIAGNOSIS — Z96659 Presence of unspecified artificial knee joint: Secondary | ICD-10-CM

## 2012-03-06 HISTORY — PX: TOTAL KNEE REVISION: SHX996

## 2012-03-06 LAB — CBC
Hemoglobin: 12.7 g/dL (ref 12.0–15.0)
MCH: 25.3 pg — ABNORMAL LOW (ref 26.0–34.0)
RBC: 5.01 MIL/uL (ref 3.87–5.11)
WBC: 15.2 10*3/uL — ABNORMAL HIGH (ref 4.0–10.5)

## 2012-03-06 LAB — CREATININE, SERUM
Creatinine, Ser: 0.94 mg/dL (ref 0.50–1.10)
GFR calc Af Amer: 77 mL/min — ABNORMAL LOW (ref >90)

## 2012-03-06 SURGERY — TOTAL KNEE REVISION
Anesthesia: General | Site: Knee | Laterality: Right | Wound class: Clean

## 2012-03-06 MED ORDER — SODIUM CHLORIDE 0.9 % IV SOLN: INTRAVENOUS | Status: DC

## 2012-03-06 MED ORDER — ACETAMINOPHEN 325 MG PO TABS: 650.0000 mg | ORAL_TABLET | Freq: Four times a day (QID) | ORAL | Status: DC | PRN

## 2012-03-06 MED ORDER — ONDANSETRON HCL 4 MG PO TABS: 4.0000 mg | ORAL_TABLET | Freq: Four times a day (QID) | ORAL | Status: DC | PRN

## 2012-03-06 MED ORDER — ZOLPIDEM TARTRATE 5 MG PO TABS: 5.0000 mg | ORAL_TABLET | Freq: Every evening | ORAL | Status: DC | PRN

## 2012-03-06 MED ORDER — CHLORHEXIDINE GLUCONATE 4 % EX LIQD: 60.0000 mL | Freq: Once | CUTANEOUS | Status: DC

## 2012-03-06 MED ORDER — PROPOFOL 10 MG/ML IV BOLUS
INTRAVENOUS | Status: DC | PRN
  Administered 2012-03-06: 200 mg via INTRAVENOUS

## 2012-03-06 MED ORDER — DIPHENHYDRAMINE HCL 12.5 MG/5ML PO ELIX: 12.5000 mg | ORAL_SOLUTION | ORAL | Status: DC | PRN

## 2012-03-06 MED ORDER — ACETAMINOPHEN 10 MG/ML IV SOLN
1000.0000 mg | Freq: Four times a day (QID) | INTRAVENOUS | Status: AC
  Administered 2012-03-06 – 2012-03-07 (×4): 1000 mg via INTRAVENOUS
  Filled 2012-03-06 (×4): qty 100

## 2012-03-06 MED ORDER — DOCUSATE SODIUM 100 MG PO CAPS
100.0000 mg | ORAL_CAPSULE | Freq: Two times a day (BID) | ORAL | Status: DC
  Administered 2012-03-06 – 2012-03-10 (×8): 100 mg via ORAL
  Filled 2012-03-06 (×8): qty 1

## 2012-03-06 MED ORDER — FENTANYL CITRATE 0.05 MG/ML IJ SOLN
INTRAMUSCULAR | Status: AC
Start: 2012-03-06 — End: 2012-03-06
  Administered 2012-03-06: 100 ug
  Filled 2012-03-06: qty 2

## 2012-03-06 MED ORDER — METOCLOPRAMIDE HCL 5 MG/ML IJ SOLN: 5.0000 mg | Freq: Three times a day (TID) | INTRAMUSCULAR | Status: DC | PRN

## 2012-03-06 MED ORDER — ONDANSETRON HCL 4 MG/2ML IJ SOLN
INTRAMUSCULAR | Status: DC | PRN
  Administered 2012-03-06: 4 mg via INTRAVENOUS

## 2012-03-06 MED ORDER — ALUM & MAG HYDROXIDE-SIMETH 200-200-20 MG/5ML PO SUSP: 30.0000 mL | ORAL | Status: DC | PRN

## 2012-03-06 MED ORDER — HYDROMORPHONE HCL PF 1 MG/ML IJ SOLN
INTRAMUSCULAR | Status: AC
  Filled 2012-03-06: qty 1

## 2012-03-06 MED ORDER — LACTATED RINGERS IV SOLN
INTRAVENOUS | Status: DC | PRN
  Administered 2012-03-06 (×2): via INTRAVENOUS

## 2012-03-06 MED ORDER — DEXAMETHASONE SODIUM PHOSPHATE 4 MG/ML IJ SOLN
INTRAMUSCULAR | Status: DC | PRN
  Administered 2012-03-06: 10 mg

## 2012-03-06 MED ORDER — OXYCODONE HCL 5 MG/5ML PO SOLN: 5.0000 mg | Freq: Once | ORAL | Status: DC | PRN

## 2012-03-06 MED ORDER — OXYCODONE HCL 5 MG PO TABS: 5.0000 mg | ORAL_TABLET | Freq: Once | ORAL | Status: DC | PRN

## 2012-03-06 MED ORDER — BUPIVACAINE-EPINEPHRINE 0.25% -1:200000 IJ SOLN
INTRAMUSCULAR | Status: DC | PRN
  Administered 2012-03-06: 20 mL

## 2012-03-06 MED ORDER — SODIUM CHLORIDE 0.9 % IR SOLN
Status: DC | PRN
  Administered 2012-03-06: 1000 mL
  Administered 2012-03-06: 3000 mL

## 2012-03-06 MED ORDER — VANCOMYCIN HCL 1000 MG IV SOLR
1000.0000 mg | INTRAVENOUS | Status: DC | PRN
  Administered 2012-03-06: 1500 mg via INTRAVENOUS

## 2012-03-06 MED ORDER — HYDROMORPHONE HCL PF 1 MG/ML IJ SOLN
INTRAMUSCULAR | Status: DC | PRN
  Administered 2012-03-06 (×2): 0.5 mg via INTRAVENOUS

## 2012-03-06 MED ORDER — ACETAMINOPHEN 650 MG RE SUPP: 650.0000 mg | Freq: Four times a day (QID) | RECTAL | Status: DC | PRN

## 2012-03-06 MED ORDER — INFLUENZA VIRUS VACC SPLIT PF IM SUSP
0.5000 mL | INTRAMUSCULAR | Status: AC
  Filled 2012-03-06: qty 0.5

## 2012-03-06 MED ORDER — METHOCARBAMOL 100 MG/ML IJ SOLN
500.0000 mg | Freq: Four times a day (QID) | INTRAVENOUS | Status: DC | PRN
  Filled 2012-03-06: qty 5

## 2012-03-06 MED ORDER — HYDROMORPHONE HCL PF 1 MG/ML IJ SOLN
0.5000 mg | INTRAMUSCULAR | Status: DC | PRN
  Administered 2012-03-07 (×2): 1 mg via INTRAVENOUS
  Filled 2012-03-06 (×2): qty 1

## 2012-03-06 MED ORDER — BUPIVACAINE-EPINEPHRINE PF 0.5-1:200000 % IJ SOLN
INTRAMUSCULAR | Status: DC | PRN
  Administered 2012-03-06: 150 mg

## 2012-03-06 MED ORDER — LACTATED RINGERS IV SOLN
INTRAVENOUS | Status: DC
  Administered 2012-03-06: 11:00:00 via INTRAVENOUS

## 2012-03-06 MED ORDER — DEXAMETHASONE 6 MG PO TABS: 10.0000 mg | ORAL_TABLET | Freq: Three times a day (TID) | ORAL | Status: DC

## 2012-03-06 MED ORDER — VANCOMYCIN HCL IN DEXTROSE 1-5 GM/200ML-% IV SOLN
1000.0000 mg | Freq: Two times a day (BID) | INTRAVENOUS | Status: AC
  Administered 2012-03-06: 1000 mg via INTRAVENOUS
  Filled 2012-03-06: qty 200

## 2012-03-06 MED ORDER — PHENOL 1.4 % MT LIQD
1.0000 | OROMUCOSAL | Status: DC | PRN
Start: 2012-03-06 — End: 2012-03-10

## 2012-03-06 MED ORDER — BISACODYL 10 MG RE SUPP: 10.0000 mg | Freq: Every day | RECTAL | Status: DC | PRN

## 2012-03-06 MED ORDER — MENTHOL 3 MG MT LOZG: 1.0000 | LOZENGE | OROMUCOSAL | Status: DC | PRN

## 2012-03-06 MED ORDER — POTASSIUM CHLORIDE IN NACL 20-0.9 MEQ/L-% IV SOLN
INTRAVENOUS | Status: DC
  Administered 2012-03-07: 06:00:00 via INTRAVENOUS
  Filled 2012-03-06 (×12): qty 1000

## 2012-03-06 MED ORDER — ENOXAPARIN SODIUM 30 MG/0.3ML ~~LOC~~ SOLN
30.0000 mg | Freq: Two times a day (BID) | SUBCUTANEOUS | Status: DC
  Administered 2012-03-07 – 2012-03-10 (×7): 30 mg via SUBCUTANEOUS
  Filled 2012-03-06 (×9): qty 0.3

## 2012-03-06 MED ORDER — ACETAMINOPHEN 10 MG/ML IV SOLN
INTRAVENOUS | Status: AC
  Administered 2012-03-06: 1000 mg via INTRAVENOUS
  Filled 2012-03-06: qty 100

## 2012-03-06 MED ORDER — OXYCODONE HCL ER 10 MG PO T12A
20.0000 mg | EXTENDED_RELEASE_TABLET | Freq: Two times a day (BID) | ORAL | Status: DC
  Administered 2012-03-06 – 2012-03-08 (×4): 20 mg via ORAL
  Filled 2012-03-06 (×4): qty 2

## 2012-03-06 MED ORDER — MIDAZOLAM HCL 2 MG/2ML IJ SOLN
INTRAMUSCULAR | Status: AC
  Administered 2012-03-06: 2 mg
  Filled 2012-03-06: qty 2

## 2012-03-06 MED ORDER — EPHEDRINE SULFATE 50 MG/ML IJ SOLN
INTRAMUSCULAR | Status: DC | PRN
  Administered 2012-03-06 (×2): 5 mg via INTRAVENOUS

## 2012-03-06 MED ORDER — ONDANSETRON HCL 4 MG/2ML IJ SOLN
4.0000 mg | Freq: Four times a day (QID) | INTRAMUSCULAR | Status: DC | PRN
  Administered 2012-03-07: 4 mg via INTRAVENOUS
  Filled 2012-03-06: qty 2

## 2012-03-06 MED ORDER — BUPIVACAINE 0.25 % ON-Q PUMP SINGLE CATH 300ML
INJECTION | Status: DC | PRN
  Administered 2012-03-06: 300 mL

## 2012-03-06 MED ORDER — FENTANYL CITRATE 0.05 MG/ML IJ SOLN
INTRAMUSCULAR | Status: DC | PRN
  Administered 2012-03-06: 25 ug via INTRAVENOUS
  Administered 2012-03-06: 100 ug via INTRAVENOUS
  Administered 2012-03-06 (×3): 25 ug via INTRAVENOUS
  Administered 2012-03-06: 50 ug via INTRAVENOUS

## 2012-03-06 MED ORDER — OXYCODONE HCL 5 MG PO TABS
ORAL_TABLET | ORAL | Status: AC
  Filled 2012-03-06: qty 2

## 2012-03-06 MED ORDER — METOCLOPRAMIDE HCL 10 MG PO TABS: 5.0000 mg | ORAL_TABLET | Freq: Three times a day (TID) | ORAL | Status: DC | PRN

## 2012-03-06 MED ORDER — PROMETHAZINE HCL 25 MG/ML IJ SOLN: 6.2500 mg | INTRAMUSCULAR | Status: DC | PRN

## 2012-03-06 MED ORDER — DEXAMETHASONE SODIUM PHOSPHATE 10 MG/ML IJ SOLN: 10.0000 mg | Freq: Three times a day (TID) | INTRAMUSCULAR | Status: DC

## 2012-03-06 MED ORDER — METHOCARBAMOL 500 MG PO TABS
500.0000 mg | ORAL_TABLET | Freq: Four times a day (QID) | ORAL | Status: DC | PRN
  Administered 2012-03-06 – 2012-03-09 (×3): 500 mg via ORAL
  Filled 2012-03-06 (×2): qty 1

## 2012-03-06 MED ORDER — METHOCARBAMOL 500 MG PO TABS
ORAL_TABLET | ORAL | Status: AC
  Filled 2012-03-06: qty 1

## 2012-03-06 MED ORDER — OXYCODONE HCL 5 MG PO TABS
5.0000 mg | ORAL_TABLET | ORAL | Status: DC | PRN
  Administered 2012-03-06: 10 mg via ORAL
  Administered 2012-03-07: 5 mg via ORAL
  Administered 2012-03-07: 10 mg via ORAL
  Administered 2012-03-07: 5 mg via ORAL
  Administered 2012-03-08 – 2012-03-10 (×11): 10 mg via ORAL
  Filled 2012-03-06 (×4): qty 2
  Filled 2012-03-06: qty 1
  Filled 2012-03-06 (×7): qty 2
  Filled 2012-03-06: qty 1
  Filled 2012-03-06: qty 2

## 2012-03-06 MED ORDER — HYDROMORPHONE HCL PF 1 MG/ML IJ SOLN
0.2500 mg | INTRAMUSCULAR | Status: DC | PRN
  Administered 2012-03-06 (×2): 0.5 mg via INTRAVENOUS

## 2012-03-06 SURGICAL SUPPLY — 73 items
BANDAGE ESMARK 6X9 LF (GAUZE/BANDAGES/DRESSINGS) ×1 IMPLANT
BLADE SAGITTAL 13X1.27X60 (BLADE) ×2 IMPLANT
BLADE SAW SGTL 13X75X1.27 (BLADE) ×2 IMPLANT
BLADE SAW SGTL 83.5X18.5 (BLADE) ×2 IMPLANT
BLADE SAW SGTL NAR THIN XSHT (BLADE) ×2 IMPLANT
BNDG ESMARK 6X9 LF (GAUZE/BANDAGES/DRESSINGS) ×2
BONE CEMENT PALACOSE (Orthopedic Implant) ×4 IMPLANT
BOWL SMART MIX CTS (DISPOSABLE) ×2 IMPLANT
CATH KIT ON Q 5IN SLV (PAIN MANAGEMENT) ×2 IMPLANT
CEMENT BONE PALACOSE (Orthopedic Implant) ×2 IMPLANT
CLOTH BEACON ORANGE TIMEOUT ST (SAFETY) ×2 IMPLANT
CONT SPEC 4OZ CLIKSEAL STRL BL (MISCELLANEOUS) ×2 IMPLANT
COVER SURGICAL LIGHT HANDLE (MISCELLANEOUS) ×2 IMPLANT
CUFF TOURNIQUET SINGLE 34IN LL (TOURNIQUET CUFF) IMPLANT
DISTAL AUG ZIMMER (Orthopedic Implant) ×6 IMPLANT
DRAPE EXTREMITY T 121X128X90 (DRAPE) ×2 IMPLANT
DRAPE INCISE IOBAN 66X45 STRL (DRAPES) ×4 IMPLANT
DRAPE PROXIMA HALF (DRAPES) ×2 IMPLANT
DRAPE U-SHAPE 47X51 STRL (DRAPES) ×2 IMPLANT
DRSG ADAPTIC 3X8 NADH LF (GAUZE/BANDAGES/DRESSINGS) ×2 IMPLANT
DRSG PAD ABDOMINAL 8X10 ST (GAUZE/BANDAGES/DRESSINGS) ×2 IMPLANT
DURAPREP 26ML APPLICATOR (WOUND CARE) ×2 IMPLANT
ELECT REM PT RETURN 9FT ADLT (ELECTROSURGICAL) ×2
ELECTRODE REM PT RTRN 9FT ADLT (ELECTROSURGICAL) ×1 IMPLANT
EVACUATOR 1/8 PVC DRAIN (DRAIN) ×2 IMPLANT
FACESHIELD LNG OPTICON STERILE (SAFETY) ×2 IMPLANT
FEMORAL COMP ZIMMER (Orthopedic Implant) ×2 IMPLANT
GLOVE BIOGEL M 7.0 STRL (GLOVE) ×2 IMPLANT
GLOVE BIOGEL PI IND STRL 7.5 (GLOVE) IMPLANT
GLOVE BIOGEL PI IND STRL 8.5 (GLOVE) ×1 IMPLANT
GLOVE BIOGEL PI INDICATOR 7.5 (GLOVE)
GLOVE BIOGEL PI INDICATOR 8.5 (GLOVE) ×1
GLOVE BIOGEL PI ORTHO PRO SZ8 (GLOVE) ×1
GLOVE PI ORTHO PRO STRL SZ8 (GLOVE) ×1 IMPLANT
GLOVE SURG ORTHO 8.0 STRL STRW (GLOVE) ×4 IMPLANT
GOWN PREVENTION PLUS XLARGE (GOWN DISPOSABLE) ×4 IMPLANT
GOWN STRL NON-REIN LRG LVL3 (GOWN DISPOSABLE) ×4 IMPLANT
HANDPIECE INTERPULSE COAX TIP (DISPOSABLE) ×1
HOOD PEEL AWAY FACE SHEILD DIS (HOOD) ×6 IMPLANT
KIT BASIN OR (CUSTOM PROCEDURE TRAY) ×2 IMPLANT
KIT ROOM TURNOVER OR (KITS) ×2 IMPLANT
LPS ARTI SURF EF 3-4 14MM (Orthopedic Implant) ×2 IMPLANT
MANIFOLD NEPTUNE II (INSTRUMENTS) ×2 IMPLANT
NEEDLE 18GX1X1/2 (RX/OR ONLY) (NEEDLE) IMPLANT
NEEDLE 22X1 1/2 (OR ONLY) (NEEDLE) ×2 IMPLANT
NS IRRIG 1000ML POUR BTL (IV SOLUTION) ×2 IMPLANT
PACK TOTAL JOINT (CUSTOM PROCEDURE TRAY) ×2 IMPLANT
PAD ARMBOARD 7.5X6 YLW CONV (MISCELLANEOUS) ×4 IMPLANT
PADDING CAST COTTON 6X4 STRL (CAST SUPPLIES) ×2 IMPLANT
POSITIONER HEAD PRONE TRACH (MISCELLANEOUS) ×2 IMPLANT
SET HNDPC FAN SPRY TIP SCT (DISPOSABLE) ×1 IMPLANT
SPONGE GAUZE 4X4 12PLY (GAUZE/BANDAGES/DRESSINGS) ×2 IMPLANT
STAPLER VISISTAT 35W (STAPLE) ×2 IMPLANT
STEM EXTENSION 13MMX145MM STRL (Stem) ×2 IMPLANT
STEM ST EXT ZIER 100X145X10X (Stem) ×1 IMPLANT
STEM ST EXT ZIMMER (Stem) ×1 IMPLANT
SUCTION FRAZIER TIP 10 FR DISP (SUCTIONS) ×2 IMPLANT
SUT BONE WAX W31G (SUTURE) IMPLANT
SUT PDS AB 0 CT 36 (SUTURE) IMPLANT
SUT PDS AB 1 CT  36 (SUTURE)
SUT PDS AB 1 CT 36 (SUTURE) IMPLANT
SUT PDS AB 2-0 CT1 27 (SUTURE) IMPLANT
SUT VIC AB 0 CTB1 27 (SUTURE) ×4 IMPLANT
SUT VIC AB 1 CT1 27 (SUTURE) ×2
SUT VIC AB 1 CT1 27XBRD ANBCTR (SUTURE) ×2 IMPLANT
SUT VIC AB 2-0 CTB1 (SUTURE) ×4 IMPLANT
SYR 20ML ECCENTRIC (SYRINGE) IMPLANT
TIBIA COMP ZIMMER (Orthopedic Implant) ×2 IMPLANT
TOWEL OR 17X24 6PK STRL BLUE (TOWEL DISPOSABLE) ×2 IMPLANT
TOWEL OR 17X26 10 PK STRL BLUE (TOWEL DISPOSABLE) ×2 IMPLANT
TRAY FOLEY CATH 14FR (SET/KITS/TRAYS/PACK) ×2 IMPLANT
TUBE ANAEROBIC SPECIMEN COL (MISCELLANEOUS) IMPLANT
WATER STERILE IRR 1000ML POUR (IV SOLUTION) ×4 IMPLANT

## 2012-03-06 NOTE — H&P (Signed)
  Katelyn [name] MRN:  [id number] DOB/SEX:  Oct 04, 1955/female  CHIEF COMPLAINT:  Painful right Knee  HISTORY: Patient is a 57 y.o. female presented with a history of pain in the right knee. Onset of symptoms was gradual starting several years ago with gradually worsening course since that time. Prior procedures on the knee include arthroplasty. Patient has been treated conservatively with over-the-counter NSAIDs and activity modification. Patient currently rates pain in the knee at 10 out of 10 with activity. There is pain at night.  PAST MEDICAL HISTORY: There are no active problems to display for this patient.  Past Medical History  Diagnosis Date  . Sleep apnea     never got machine  . Hypertension   . Heart murmur   . Seasonal allergies   . Tuberculosis 1997  . Arthritis    Past Surgical History  Procedure Date  . Abdominal hysterectomy     partial  . Knee arthroscopy     bilateral  . Shoulder arthroscopy     right  . Colonoscopy   . Cardiac catheterization     12/14/11 good LV systolic function, mild LVH, LAD has 10-15% proximal and mid stenosis.      MEDICATIONS:   No prescriptions prior to admission    ALLERGIES:   Allergies  Allergen Reactions  . Ibuprofen Rash  . Naproxen Rash  . Penicillins Rash    Childhood allergy     REVIEW OF SYSTEMS:  Pertinent items are noted in HPI.   FAMILY HISTORY:  No family history on file.  SOCIAL HISTORY:   History  Substance Use Topics  . Smoking status: Never Smoker   . Smokeless tobacco: Not on file  . Alcohol Use: Yes     Comment: rare     EXAMINATION:  Vital signs in last 24 hours:    General appearance: alert, cooperative and no distress Lungs: clear to auscultation bilaterally Heart: regular rate and rhythm, S1, S2 normal, no murmur, click, rub or gallop Abdomen: soft, non-tender; bowel sounds normal; no masses,  no organomegaly Extremities: extremities normal, atraumatic, no cyanosis or edema and  Homans sign is negative, no sign of DVT Pulses: 2+ and symmetric Skin: Skin color, texture, turgor normal. No rashes or lesions Neurologic: Alert and oriented X 3, normal strength and tone. Normal symmetric reflexes. Normal coordination and gait  Musculoskeletal:  ROM 0-90, Ligaments intact,  Imaging Review Plain radiographs demonstrate loosening of components  of the right knee. The overall alignment is neutral. The bone quality appears to be good for age and reported activity level.  Assessment/Plan:  right knee pain  The patient history, physical examination and imaging studies are consistent with loose components degenerative joint disease of the right knee. The patient has failed conservative treatment.  The clearance notes were reviewed.  After discussion with the patient it was felt that Total Knee Revision was indicated. The procedure,  risks, and benefits of total knee arthroplasty were presented and reviewed. The risks including but not limited to aseptic loosening, infection, blood clots, vascular injury, stiffness, patella tracking problems complications among others were discussed. The patient acknowledged the explanation, agreed to proceed with the plan.  Katelyn [name] 03/06/2012, 6:39 AM

## 2012-03-06 NOTE — Anesthesia Procedure Notes (Addendum)
Anesthesia Regional Block:  Femoral nerve block  Pre-Anesthetic Checklist: ,, timeout performed, Correct Patient, Correct Site, Correct Laterality, Correct Procedure, Correct Position, site marked, Risks and benefits discussed,  Surgical consent,  Pre-op evaluation,  At surgeon's request and post-op pain management  Laterality: Right  Prep: chloraprep       Needles:  Injection technique: Single-shot  Needle Type: Echogenic Stimulator Needle     Needle Length: 5cm 5 cm Needle Gauge: 22 and 22 G    Additional Needles:  Procedures: ultrasound guided (picture in chart) and nerve stimulator Femoral nerve block  Nerve Stimulator or Paresthesia:  Response: quadraceps contraction, 0.45 mA,   Additional Responses:   Narrative:  Start time: 03/06/2012 11:08 AM End time: 03/06/2012 11:18 AM Injection made incrementally with aspirations every 5 mL.  Performed by: Personally  Anesthesiologist: Halford Decamp, MD  Additional Notes: Functioning IV was confirmed and monitors were applied.  A 50mm 22ga Arrow echogenic stimulator needle was used. Sterile prep and drape,hand hygiene and sterile gloves were used. Ultrasound guidance: relevant anatomy identified, needle position confirmed, local anesthetic spread visualized around nerve(s)., vascular puncture avoided.  Image printed for medical record. Negative aspiration and negative test dose prior to incremental administration of local anesthetic. The patient tolerated the procedure well.    Femoral nerve block Procedure Name: LMA Insertion Performed by: Jerilee Hoh Pre-anesthesia Checklist: Patient identified, Emergency Drugs available, Suction available and Patient being monitored Patient Re-evaluated:Patient Re-evaluated prior to inductionPreoxygenation: Pre-oxygenation with 100% oxygen Intubation Type: IV induction Ventilation: Mask ventilation without difficulty LMA: LMA inserted LMA Size: 4.0 Number of attempts: 1

## 2012-03-06 NOTE — Plan of Care (Signed)
Problem: Phase I Progression Outcomes Goal: OOB as tolerated unless otherwise ordered Outcome: Progressing Dangled at EOB

## 2012-03-06 NOTE — Progress Notes (Signed)
Orthopedic Tech Progress Note Patient Details:  Katelyn [name] 1955-11-14 [id number]  CPM Right Knee CPM Right Knee: On Right Knee Flexion (Degrees): 90  Right Knee Extension (Degrees): 0  Additional Comments: applied overhead frame   Jennye Moccasin 03/06/2012, 4:19 PM

## 2012-03-06 NOTE — Anesthesia Preprocedure Evaluation (Signed)
Anesthesia Evaluation  Patient identified by MRN, date of birth, ID band Patient awake    Reviewed: Allergy & Precautions, H&P , NPO status , Patient's Chart, lab work & pertinent test results, reviewed documented beta blocker date and time   History of Anesthesia Complications Negative for: history of anesthetic complications  Airway Mallampati: II TM Distance: >3 FB Neck ROM: Full    Dental  (+) Teeth Intact   Pulmonary sleep apnea ,    Pulmonary exam normal       Cardiovascular hypertension, Pt. on medications and Pt. on home beta blockers     Neuro/Psych negative neurological ROS     GI/Hepatic negative GI ROS, Neg liver ROS,   Endo/Other  Morbid obesity  Renal/GU negative Renal ROS     Musculoskeletal   Abdominal   Peds  Hematology   Anesthesia Other Findings   Reproductive/Obstetrics                           Anesthesia Physical Anesthesia Plan  ASA: III  Anesthesia Plan: General   Post-op Pain Management:    Induction: Intravenous  Airway Management Planned: LMA  Additional Equipment:   Intra-op Plan:   Post-operative Plan: Extubation in OR  Informed Consent: I have reviewed the patients History and Physical, chart, labs and discussed the procedure including the risks, benefits and alternatives for the proposed anesthesia with the patient or authorized representative who has indicated his/her understanding and acceptance.   Dental advisory given  Plan Discussed with: CRNA, Anesthesiologist and Surgeon  Anesthesia Plan Comments:         Anesthesia Quick Evaluation

## 2012-03-06 NOTE — Transfer of Care (Signed)
Immediate Anesthesia Transfer of Care Note  Patient: Katelyn [name]  Procedure(s) Performed: Procedure(s) (LRB) with comments: TOTAL KNEE REVISION (Right)  Patient Location: PACU  Anesthesia Type:GA combined with regional for post-op pain  Level of Consciousness: awake, alert , oriented and patient cooperative  Airway & Oxygen Therapy: Patient Spontanous Breathing and Patient connected to face mask oxygen  Post-op Assessment: Report given to PACU RN, Post -op Vital signs reviewed and stable and Patient moving all extremities  Post vital signs: Reviewed and stable  Complications: No apparent anesthesia complications

## 2012-03-06 NOTE — Progress Notes (Signed)
Patient stated she didi not have anything she needed to lock up

## 2012-03-06 NOTE — Anesthesia Postprocedure Evaluation (Signed)
Anesthesia Post Note  Patient: Katelyn [name]  Procedure(s) Performed: Procedure(s) (LRB): TOTAL KNEE REVISION (Right)  Anesthesia type: general  Patient location: PACU  Post pain: Pain level controlled  Post assessment: Patient's Cardiovascular Status Stable  Last Vitals:  Filed Vitals:   03/06/12 1615  BP: 162/79  Pulse: 65  Temp:   Resp: 14    Post vital signs: Reviewed and stable  Level of consciousness: sedated  Complications: No apparent anesthesia complications

## 2012-03-06 NOTE — Preoperative (Signed)
Beta Blockers   Reason not to administer Beta Blockers:Not Applicable 

## 2012-03-06 NOTE — Evaluation (Signed)
Physical Therapy Evaluation Patient Details Name: Katelyn [name] MRN: [id number] DOB: December 18, 1955 Today's Date: 03/06/2012 Time: 4098-1191 PT Time Calculation (min): 33 min  PT Assessment / Plan / Recommendation Clinical Impression  Patient is a 57 yo female s/p Rt. TKA.  Will benefit from acute PT to maximize independence prior to discharge.  Patient does not have 24 hour assist, and has 11 stairs to enter apartment.  Recommend ST-SNF for continued therapy at discharge.    PT Assessment  Patient needs continued PT services    Follow Up Recommendations  SNF    Does the patient have the potential to tolerate intense rehabilitation      Barriers to Discharge Inaccessible home environment;Decreased caregiver support Does not have 24 hour assist.  Has 11 stairs to enter apartment.    Equipment Recommendations  None recommended by PT    Recommendations for Other Services     Frequency 7X/week    Precautions / Restrictions Precautions Precautions: Knee Precaution Booklet Issued: Yes (comment) Precaution Comments: Reviewed precautions with patient. Restrictions Weight Bearing Restrictions: Yes RLE Weight Bearing: Weight bearing as tolerated   Pertinent Vitals/Pain Pain 8/10       Mobility  Bed Mobility Bed Mobility: Supine to Sit;Sitting - Scoot to Edge of Bed;Sit to Supine Supine to Sit: 4: Min assist;With rails;HOB elevated Sitting - Scoot to Edge of Bed: 4: Min guard Sit to Supine: 4: Min assist;HOB elevated Details for Bed Mobility Assistance: Verbal cues for technique.  Assist to move RLE off of and onto bed.  Patient sat at EOB x 10 minutes with good balance.  CPM reapplied. Transfers Transfers: Not assessed (Patient very lethargic)    Exercises Total Joint Exercises Ankle Circles/Pumps: AROM;Both;10 reps;Supine   PT Diagnosis: Difficulty walking;Acute pain  PT Problem List: Decreased strength;Decreased range of motion;Decreased activity tolerance;Decreased  mobility;Decreased knowledge of use of DME;Decreased knowledge of precautions;Obesity;Pain PT Treatment Interventions: DME instruction;Gait training;Stair training;Functional mobility training;Therapeutic exercise;Patient/family education   PT Goals Acute Rehab PT Goals PT Goal Formulation: With patient Time For Goal Achievement: 03/13/12 Potential to Achieve Goals: Good Pt will go Supine/Side to Sit: Independently;with HOB 0 degrees PT Goal: Supine/Side to Sit - Progress: Goal set today Pt will go Sit to Supine/Side: Independently;with HOB 0 degrees PT Goal: Sit to Supine/Side - Progress: Goal set today Pt will go Sit to Stand: with supervision;with upper extremity assist PT Goal: Sit to Stand - Progress: Goal set today Pt will Ambulate: 51 - 150 feet;with supervision;with rolling walker PT Goal: Ambulate - Progress: Goal set today Pt will Go Up / Down Stairs: 3-5 stairs;with min assist;with rail(s);with least restrictive assistive device PT Goal: Up/Down Stairs - Progress: Goal set today Pt will Perform Home Exercise Program: with supervision, verbal cues required/provided PT Goal: Perform Home Exercise Program - Progress: Goal set today  Visit Information  Last PT Received On: 03/06/12 Assistance Needed: +2    Subjective Data  Subjective: "I'm so sleepy" Patient Stated Goal: To be able to go home.   Prior Functioning  Home Living Lives With: Shari Heritage (Son works) Available Help at Discharge: Family;Available PRN/intermittently Type of Home: Apartment Home Access: Stairs to enter Entrance Stairs-Number of Steps: 11 Entrance Stairs-Rails: Right;Left Home Layout: One level Bathroom Shower/Tub: Engineer, manufacturing systems: Standard Bathroom Accessibility: Yes How Accessible: Accessible via walker Home Adaptive Equipment: Walker - rolling;Straight cane;Bedside commode/3-in-1 Prior Function Level of Independence: Independent Able to Take Stairs?: Yes Driving: No Vocation:  Full time employment Communication Communication: No difficulties (Lethargic)  Cognition  Cognition Overall Cognitive Status: Appears within functional limits for tasks assessed/performed Arousal/Alertness: Lethargic Orientation Level: Appears intact for tasks assessed Behavior During Session: Lethargic    Extremity/Trunk Assessment Right Upper Extremity Assessment RUE ROM/Strength/Tone: WFL for tasks assessed RUE Sensation: WFL - Light Touch Left Upper Extremity Assessment LUE ROM/Strength/Tone: WFL for tasks assessed LUE Sensation: WFL - Light Touch Right Lower Extremity Assessment RLE ROM/Strength/Tone: Deficits;Unable to fully assess;Due to pain RLE ROM/Strength/Tone Deficits: Patient able to assist moving RLE off of and onto bed. Left Lower Extremity Assessment LLE ROM/Strength/Tone: WFL for tasks assessed LLE Sensation: WFL - Light Touch   Balance Balance Balance Assessed: Yes Static Sitting Balance Static Sitting - Balance Support: Right upper extremity supported;Feet supported Static Sitting - Level of Assistance: 5: Stand by assistance Static Sitting - Comment/# of Minutes: 10  End of Session PT - End of Session Equipment Utilized During Treatment: Oxygen Activity Tolerance: Patient limited by pain;Patient limited by fatigue Patient left: in bed;in CPM;with call bell/phone within reach;with family/visitor present Nurse Communication: Mobility status;Patient requests pain meds CPM Right Knee CPM Right Knee: On Right Knee Flexion (Degrees): 90  Right Knee Extension (Degrees): 0   GP     Vena Austria 03/06/2012, 7:14 PM Durenda Hurt. Renaldo Fiddler, [facility] Acute Rehab Services Pager [phone]

## 2012-03-07 ENCOUNTER — Encounter (HOSPITAL_COMMUNITY): Payer: Self-pay | Admitting: Orthopedic Surgery

## 2012-03-07 LAB — BASIC METABOLIC PANEL
CO2: 28 mEq/L (ref 19–32)
GFR calc non Af Amer: 73 mL/min — ABNORMAL LOW (ref >90)
Glucose, Bld: 236 mg/dL — ABNORMAL HIGH (ref 70–99)
Potassium: 3.7 mEq/L (ref 3.5–5.1)
Sodium: 137 mEq/L (ref 135–145)

## 2012-03-07 LAB — CBC
Hemoglobin: 11.5 g/dL — ABNORMAL LOW (ref 12.0–15.0)
MCHC: 32.9 g/dL (ref 30.0–36.0)
RBC: 4.52 MIL/uL (ref 3.87–5.11)

## 2012-03-07 MED ORDER — METOPROLOL SUCCINATE ER 100 MG PO TB24
100.0000 mg | ORAL_TABLET | Freq: Every day | ORAL | Status: DC
  Administered 2012-03-07 – 2012-03-10 (×3): 100 mg via ORAL
  Filled 2012-03-07 (×4): qty 1

## 2012-03-07 MED ORDER — LORATADINE 10 MG PO TABS
10.0000 mg | ORAL_TABLET | Freq: Every day | ORAL | Status: DC
  Administered 2012-03-07 – 2012-03-10 (×4): 10 mg via ORAL
  Filled 2012-03-07 (×4): qty 1

## 2012-03-07 MED ORDER — LORATADINE-PSEUDOEPHEDRINE ER 10-240 MG PO TB24: 1.0000 | ORAL_TABLET | Freq: Every day | ORAL | Status: DC

## 2012-03-07 MED ORDER — METOPROLOL-HCTZ ER 100-12.5 MG PO TB24: 1.0000 | ORAL_TABLET | Freq: Every day | ORAL | Status: DC

## 2012-03-07 MED ORDER — HYDROCHLOROTHIAZIDE 12.5 MG PO CAPS
12.5000 mg | ORAL_CAPSULE | Freq: Every day | ORAL | Status: DC
  Administered 2012-03-07 – 2012-03-10 (×3): 12.5 mg via ORAL
  Filled 2012-03-07 (×4): qty 1

## 2012-03-07 MED ORDER — LORATADINE-PSEUDOEPHEDRINE ER 10-240 MG PO TB24
1.0000 | ORAL_TABLET | Freq: Every day | ORAL | Status: DC
  Filled 2012-03-07: qty 1

## 2012-03-07 NOTE — Progress Notes (Signed)
Physical Therapy Treatment Patient Details Name: Katelyn [name] MRN: [id number] DOB: April 08, 1955 Today's Date: 03/07/2012 Time: 4098-1191 PT Time Calculation (min): 24 min  PT Assessment / Plan / Recommendation Comments on Treatment Session  Upon entering pt in recliner stating that her "equilibrium was off". Agreeable to exercises in seated position  and an attempt to stand and take steps. Pt reported mild dizziness and lightheadness with standing. Stood for about 45 seconds and pt stated " I'm just not feeling right. Sat pt down and called nurse in  to assess. O2 sats 88  on 2L resting. Discontinued treatment. Will attempt later today.     Follow Up Recommendations  SNF     Does the patient have the potential to tolerate intense rehabilitation     Barriers to Discharge        Equipment Recommendations  None recommended by PT    Recommendations for Other Services    Frequency 7X/week   Plan Discharge plan remains appropriate;Frequency remains appropriate    Precautions / Restrictions Precautions Precautions: Knee Restrictions Weight Bearing Restrictions: Yes RLE Weight Bearing: Weight bearing as tolerated   Pertinent Vitals/Pain O2 sats 88 with 2L. RN  Made aware.     Mobility  Transfers Transfers: Sit to Stand;Stand to Sit Sit to Stand: 4: Min assist Stand to Sit: 4: Min assist Details for Transfer Assistance: Cueing for safety and technique. Assist due to  pt being lethargic    Exercises Total Joint Exercises Ankle Circles/Pumps: AROM;10 reps;Both Quad Sets: AROM;10 reps Heel Slides: AROM;10 reps Long Arc Quad: Limitations Long Arc Quad Limitations: Unable to complete Knee Flexion: AROM;10 reps   PT Diagnosis:    PT Problem List:   PT Treatment Interventions:     PT Goals Acute Rehab PT Goals PT Goal: Sit to Stand - Progress: Progressing toward goal PT Goal: Perform Home Exercise Program - Progress: Progressing toward goal  Visit Information  Last PT  Received On: 03/07/12 Assistance Needed: +1    Subjective Data      Cognition  Cognition Overall Cognitive Status: Appears within functional limits for tasks assessed/performed Arousal/Alertness: Lethargic Orientation Level: Appears intact for tasks assessed Behavior During Session: [facility] for tasks performed    Balance     End of Session PT - End of Session Equipment Utilized During Treatment: Gait belt Activity Tolerance: Patient limited by fatigue;Treatment limited secondary to medical complications (Comment) CPM Right Knee CPM Right Knee: Off   GP     Lazaro Arms 03/07/2012, 11:08 AM

## 2012-03-07 NOTE — Progress Notes (Signed)
Inpatient Diabetes Program Recommendations  AACE/ADA: New Consensus Statement on Inpatient Glycemic Control (2013)  Target Ranges:  Prepandial:   less than 140 mg/dL      Peak postprandial:   less than 180 mg/dL (1-2 hours)      Critically ill patients:  140 - 180 mg/dL   Results for Katelyn [name], Katelyn [name] (MRN [id number]) as of 03/07/2012 13:49  Ref. Range 12/06/2011 08:45  Hemoglobin A1C Latest Range: <5.7 % 7.0 (H)  Results for Katelyn [name], Katelyn [name] (MRN [id number]) as of 03/07/2012 13:49  Ref. Range 03/07/2012 05:00  Glucose Latest Range: 70-99 mg/dL 782 (H)    Note: Patient does not have [name] documented history of diabetes.  However, patient meets ADA criteria for diagnosis of diabetes since A1C is greater than 6.5%.  Fasting blood glucose this morning was 236 mg/dl.  Please consider ordering CBGs with Novolog correction ACHS if needed and advise whether patient has diagnosis of diabetes and if so, whether she needs diabetes education.  Also may want to consider changing diet from regular to Carbohydrate Modified diabetic diet.  Will continue to follow.  Thanks, Orlando Penner, RN, BSN, CCRN Diabetes Coordinator Inpatient Diabetes Program [phone]

## 2012-03-07 NOTE — Progress Notes (Signed)
Physical Therapy Treatment Patient Details Name: Katelyn [name] MRN: [id number] DOB: January 26, 1956 Today's Date: 03/07/2012 Time: 4098-1191 PT Time Calculation (min): 28 min  PT Assessment / Plan / Recommendation Comments on Treatment Session  Pt slowly progressing with increaseed activity tolerance this session. Able to ambulate from recliner to bathroom and back to bed. No adverse effects. CPM reapplied. Will continue to progress pt per current POC.    Follow Up Recommendations  SNF     Does the patient have the potential to tolerate intense rehabilitation     Barriers to Discharge        Equipment Recommendations  None recommended by PT    Recommendations for Other Services    Frequency 7X/week   Plan Discharge plan remains appropriate;Frequency remains appropriate    Precautions / Restrictions Precautions Precautions: Knee Restrictions RLE Weight Bearing: Weight bearing as tolerated   Pertinent Vitals/Pain no apparent distress     Mobility  Bed Mobility Bed Mobility: Scooting to HOB Sit to Supine: 4: Min guard Scooting to HOB: 4: Min guard;With trapeze Details for Bed Mobility Assistance: Pt able to self assist with LLE to manage RLE onto bed. Cueing for technique to scoot HOB. Transfers Sit to Stand: 4: Min assist;From chair/3-in-1;From toilet Stand to Sit: 4: Min assist;To bed;To chair/3-in-1 Details for Transfer Assistance: Use of grab bars for toilet transfer. Ambulation/Gait Ambulation/Gait Assistance: 4: Min assist Ambulation Distance (Feet): 12 Feet Assistive device: Rolling walker Ambulation/Gait Assistance Details: Verbal cueing for upright posture and step length Gait Pattern: Step-to pattern;Decreased step length - left    Exercises     PT Diagnosis:    PT Problem List:   PT Treatment Interventions:     PT Goals Acute Rehab PT Goals PT Goal: Sit to Supine/Side - Progress: Progressing toward goal PT Goal: Sit to Stand - Progress: Progressing  toward goal PT Goal: Ambulate - Progress: Progressing toward goal  Visit Information  Last PT Received On: 03/07/12 Assistance Needed: +1    Subjective Data      Cognition  Cognition Overall Cognitive Status: Appears within functional limits for tasks assessed/performed Arousal/Alertness: Awake/alert Orientation Level: Appears intact for tasks assessed Behavior During Session: [facility] for tasks performed    Balance     End of Session PT - End of Session Equipment Utilized During Treatment: Gait belt;Oxygen Activity Tolerance: Patient tolerated treatment well Patient left: in bed;with call bell/phone within reach CPM Right Knee CPM Right Knee: On Right Knee Flexion (Degrees): 80  Right Knee Extension (Degrees): 0    GP     Lazaro Arms 03/07/2012, 3:12 PM

## 2012-03-07 NOTE — Clinical Social Work Psychosocial (Signed)
Clinical Social Work Department  BRIEF PSYCHOSOCIAL ASSESSMENT  Patient: Katelyn [name]  Account Number: [id number]  Admit date: 03/06/2012 Clinical Social Worker Sabino Niemann, MSW Date/Time: 03/07/2012 4:15 PM Referred by: Physician Date Referred: 03/07/2012 Referred for   SNF Placement   Other Referral:  Interview type: Patient  Other interview type:PSYCHOSOCIAL DATA  Living Status: Alone Admitted from facility:  Level of care:  Primary support name: Gordon,Jerrod   Primary support relationship to patient: other Degree of support available:  fair  CURRENT CONCERNS  Current Concerns   Post-Acute Placement   Other Concerns:  SOCIAL WORK ASSESSMENT / PLAN  CSW met with pt re: PT recommendation for SNF.   Pt lives alone  CSW explained placement process and answered questions.   Pt reports has no preference at this time and is not sure if she wants to go to SNF. Patient as agreeable to CSW faxing out patient's info  CSW completed FL2 and initiatedSNF search.     Assessment/plan status: Information/Referral to [facility]  Other assessment/ plan:  Information/referral to community resources:  SNF   PTAR   PATIENT'S/FAMILY'S RESPONSE TO PLAN OF CARE:  Pt  Reports she is some what agreeable to ST SNF in order to increase strength and independence with mobility prior to return home  Pt verbalized understanding of placement process and appreciation for CSW assist.   Sabino Niemann, MSW [phone]

## 2012-03-07 NOTE — Progress Notes (Signed)
Patient not feeling well this AM with complaints of lightheadedness and dizziness. Per RN, patient stated she felt dizzy and nausea earlier this morning. RN questioning if its her pain meds causing this feeling. Will attempt to see patient again this afternoon as time allows.  Seen and agreed 03/07/2012 Fredrich Birks PTA [phone] pager [phone] office

## 2012-03-07 NOTE — Progress Notes (Signed)
UR COMPLETED  

## 2012-03-07 NOTE — Op Note (Signed)
Dictation [id number]

## 2012-03-07 NOTE — Clinical Social Work Placement (Signed)
Clinical Social Work Department  CLINICAL SOCIAL WORK PLACEMENT NOTE  03/07/2012 Patient: Katelyn [name] Account Number:  [id number] Admit date: 03/06/2012  Clinical Social Worker: Sabino Niemann MSW  Date/time: 03/07/2012 4:30 PM  Clinical Social Work is seeking post-discharge placement for this patient at the following level of care: SKILLED NURSING (*CSW will update this form in Epic as items are completed)  2/4/2014Patient/family provided with [facility] Department of Clinical Social Work's list of facilities offering this level of care within the geographic area requested by the patient (or if unable, by the patient's family).  03/07/2012 Patient/family informed of their freedom to choose among providers that offer the needed level of care, that participate in Medicare, Medicaid or managed care program needed by the patient, have an available bed and are willing to accept the patient.  2/4/2014Patient/family informed of [facility]' ownership interest in [facility], as well as of the fact that they are under no obligation to receive care at this facility.  PASARR submitted to EDS on  PASARR number received from EDS on FL2 transmitted to all facilities in geographic area requested by pt/family on 03/07/2012 FL2 transmitted to all facilities within larger geographic area on  Patient informed that his/her managed care company has contracts with or will negotiate with certain facilities, including the following:  Patient/family informed of bed offers received: Patient chooses bed at  Physician recommends and patient chooses bed at  Patient to be transferred to on  Patient to be transferred to facility by  The following physician request were entered in Epic:  Additional Comments:

## 2012-03-07 NOTE — Progress Notes (Signed)
SPORTS MEDICINE AND JOINT REPLACEMENT  Georgena Spurling, MD   Altamese Cabal, PA-C [address] [place], [place], [place]  [address]                             [phone]   PROGRESS NOTE  Subjective:  negative for Chest Pain  negative for Shortness of Breath  negative for Nausea/Vomiting   negative for Calf Pain  negative for Bowel Movement   Tolerating Diet: yes         Patient reports pain as 6 on 0-10 scale.    Objective: Vital signs in last 24 hours:   Patient Vitals for the past 24 hrs:  BP Temp Pulse Resp SpO2  03/07/12 0800 - - - 12  99 %  03/07/12 0600 120/59 mmHg 97.7 F (36.5 C) 64  18  97 %  03/07/12 0400 - - - 18  99 %  03/07/12 0154 110/57 mmHg 97.6 F (36.4 C) 57  18  97 %  03/07/12 0018 - - - 18  98 %  03/06/12 2101 144/80 mmHg 97.5 F (36.4 C) 64  18  98 %  03/06/12 2000 - - - 18  100 %  03/06/12 1803 154/82 mmHg 97.5 F (36.4 C) 56  18  -  03/06/12 1645 161/74 mmHg 97.5 F (36.4 C) 62  18  98 %  03/06/12 1615 162/79 mmHg 97.9 F (36.6 C) 65  14  98 %  03/06/12 1600 170/81 mmHg - 54  13  95 %  03/06/12 1545 180/92 mmHg - 62  16  95 %  03/06/12 1530 174/91 mmHg - 57  16  93 %  03/06/12 1515 170/82 mmHg - 60  13  98 %  03/06/12 1500 160/91 mmHg 97.6 F (36.4 C) 64  14  91 %    @flow {1959:LAST@   Intake/Output from previous day:   02/03 0701 - 02/04 0700 In: 1600 [I.V.:1600] Out: 2625 [Urine:2200; Drains:325]   Intake/Output this shift:       Intake/Output      02/03 0701 - 02/04 0700 02/04 0701 - 02/05 0700   I.V. 1600    Total Intake 1600    Urine 2200    Drains 325    Blood 100    Total Output 2625    Net -1025         Urine Occurrence  1 x      LABORATORY DATA:  Basename 03/07/12 0500 03/06/12 1801  WBC 13.3* 15.2*  HGB 11.5* 12.7  HCT 35.0* 38.9  PLT 253 262    Basename 03/07/12 0500 03/06/12 1801  NA 137 --  K 3.7 --  CL 99 --  CO2 28 --  BUN 20 --  CREATININE 0.87 0.94  GLUCOSE 236* --  CALCIUM 8.5 --   Lab  Results  Component Value Date   INR 0.97 02/25/2012    Examination:  General appearance: alert, cooperative and no distress Extremities: Homans sign is negative, no sign of DVT  Wound Exam: clean, dry, intact   Drainage:  None: wound tissue dry  Motor Exam: EHL and FHL Intact  Sensory Exam: Deep Peroneal normal   Assessment:    1 Day Post-Op  Procedure(s) (LRB): TOTAL KNEE REVISION (Right)  ADDITIONAL DIAGNOSIS:  Active Problems:  * No active hospital problems. *   Acute Blood Loss Anemia   Plan: Physical Therapy as ordered Weight Bearing as Tolerated (WBAT)  DVT Prophylaxis:  Lovenox  DISCHARGE PLAN: Home vs snf  DISCHARGE NEEDS: HHPT, CPM, Walker and 3-in-1 comode seat         Katelyn [name] 03/07/2012, 1:30 PM

## 2012-03-08 ENCOUNTER — Inpatient Hospital Stay (HOSPITAL_COMMUNITY): Payer: 59

## 2012-03-08 LAB — CBC
MCV: 77.8 fL — ABNORMAL LOW (ref 78.0–100.0)
Platelets: 256 10*3/uL (ref 150–400)
RBC: 4.33 MIL/uL (ref 3.87–5.11)
WBC: 13.7 10*3/uL — ABNORMAL HIGH (ref 4.0–10.5)

## 2012-03-08 LAB — BASIC METABOLIC PANEL
CO2: 30 mEq/L (ref 19–32)
Chloride: 99 mEq/L (ref 96–112)
Creatinine, Ser: 0.92 mg/dL (ref 0.50–1.10)
GFR calc Af Amer: 79 mL/min — ABNORMAL LOW (ref >90)
Potassium: 3.6 mEq/L (ref 3.5–5.1)
Sodium: 136 mEq/L (ref 135–145)

## 2012-03-08 MED ORDER — OXYCODONE HCL 5 MG PO TABS: 5.0000 mg | ORAL_TABLET | ORAL | Status: DC | PRN

## 2012-03-08 MED ORDER — SODIUM CHLORIDE 0.9 % IV BOLUS (SEPSIS)
500.0000 mL | Freq: Once | INTRAVENOUS | Status: AC
  Administered 2012-03-08: 500 mL via INTRAVENOUS

## 2012-03-08 MED ORDER — OXYCODONE HCL ER 10 MG PO T12A: 10.0000 mg | EXTENDED_RELEASE_TABLET | Freq: Two times a day (BID) | ORAL | Status: DC

## 2012-03-08 MED ORDER — ENOXAPARIN SODIUM 40 MG/0.4ML ~~LOC~~ SOLN: 40.0000 mg | Freq: Every day | SUBCUTANEOUS | Status: DC

## 2012-03-08 MED ORDER — METHOCARBAMOL 500 MG PO TABS: 500.0000 mg | ORAL_TABLET | Freq: Four times a day (QID) | ORAL | Status: DC | PRN

## 2012-03-08 NOTE — Progress Notes (Signed)
Walking by room in hall, RN called me into eval pt d/t pt desat (reported 38% sat).  Upon entering room, pt was performing IS w/ sat 92% on 2 lpm [place].  As soon as pt stopped performing IS, sats dropped to 84-86% on 2 LPM [place].  Increased [place] to 6 lpm [place].  RN aware-in room, sats increased to 92-94% on 6 LPM [place].  RN states MD aware and cxray is ordered. Pt denies any SOB, HR 82-86, no signs of distress noted.  No cyanosis noted. Pt appears to be comfortable.

## 2012-03-08 NOTE — Progress Notes (Signed)
Physical Therapy Treatment Patient Details Name: Katelyn [name] MRN: [id number] DOB: 14-Nov-1955 Today's Date: 03/08/2012 Time: 1032-1100 PT Time Calculation (min): 28 min  PT Assessment / Plan / Recommendation Comments on Treatment Session  Pt continues to progress slowly, was limited today by decreased BP, 97/44 in standing. PT will continue to follow.    Follow Up Recommendations  SNF     Does the patient have the potential to tolerate intense rehabilitation     Barriers to Discharge        Equipment Recommendations  None recommended by PT    Recommendations for Other Services    Frequency 7X/week   Plan Discharge plan remains appropriate;Frequency remains appropriate    Precautions / Restrictions Precautions Precautions: Knee Precaution Booklet Issued: No Restrictions Weight Bearing Restrictions: Yes RLE Weight Bearing: Weight bearing as tolerated   Pertinent Vitals/Pain BP standing 97/44, symptomatic Sitting 102/57, RN notified    Mobility  Bed Mobility Bed Mobility: Not assessed Transfers Transfers: Sit to Stand;Stand to Sit Sit to Stand: 4: Min assist;From chair/3-in-1;With upper extremity assist Stand to Sit: 4: Min assist;To chair/3-in-1;With upper extremity assist Details for Transfer Assistance: pt has difficulty with full weight left knee extension for sit to stand, min A needed Ambulation/Gait Ambulation/Gait Assistance: 4: Min assist Ambulation Distance (Feet): 12 Feet Assistive device: Rolling walker Ambulation/Gait Assistance Details: pt began ambulation well but had to discontinue due to dizziness. vc's for upright posture and pushing RW ahead of her enough that she wasn't leaning over the front of it Gait Pattern: Step-to pattern;Decreased step length - left Gait velocity: decreased Stairs: No Wheelchair Mobility Wheelchair Mobility: No    Exercises Total Joint Exercises Ankle Circles/Pumps: AROM;15 reps;Both;Seated Quad Sets: AROM;10  reps;Right;Seated Heel Slides: 10 reps;AAROM;Seated Hip ABduction/ADduction: AAROM;10 reps;Right;Seated Straight Leg Raises: AAROM;Right;10 reps;Seated Long Arc Quad: AAROM;Right;10 reps;Seated Knee Flexion: AAROM;10 reps;Right;Seated   PT Diagnosis:    PT Problem List:   PT Treatment Interventions:     PT Goals Acute Rehab PT Goals PT Goal Formulation: With patient Time For Goal Achievement: 03/13/12 Potential to Achieve Goals: Good Pt will go Supine/Side to Sit: Independently;with HOB 0 degrees PT Goal: Supine/Side to Sit - Progress: Progressing toward goal Pt will go Sit to Supine/Side: Independently;with HOB 0 degrees PT Goal: Sit to Supine/Side - Progress: Progressing toward goal Pt will go Sit to Stand: with supervision;with upper extremity assist PT Goal: Sit to Stand - Progress: Progressing toward goal Pt will Ambulate: 51 - 150 feet;with supervision;with rolling walker PT Goal: Ambulate - Progress: Progressing toward goal Pt will Go Up / Down Stairs: 3-5 stairs;with min assist;with rail(s);with least restrictive assistive device PT Goal: Up/Down Stairs - Progress: Discontinued (comment) (going to SNF) Pt will Perform Home Exercise Program: with supervision, verbal cues required/provided PT Goal: Perform Home Exercise Program - Progress: Progressing toward goal  Visit Information  Last PT Received On: 03/08/12 Assistance Needed: +1    Subjective Data  Subjective: This surgery has given me so much more trouble than the first one Patient Stated Goal: To be able to go home.   Cognition  Cognition Overall Cognitive Status: Appears within functional limits for tasks assessed/performed Arousal/Alertness: Awake/alert Orientation Level: Appears intact for tasks assessed Behavior During Session: [facility] for tasks performed    Balance  Balance Balance Assessed: Yes Dynamic Standing Balance Dynamic Standing - Balance Support: Bilateral upper extremity supported;During  functional activity Dynamic Standing - Level of Assistance: 4: Min assist (due to dizziness)  End of Session PT -  End of Session Equipment Utilized During Treatment: Gait belt Activity Tolerance: Other (comment) (limited by dizziness and decreased BP) Patient left: in chair;with call bell/phone within reach Nurse Communication: Other (comment) (BP) CPM Right Knee CPM Right Knee: Off   GP   Lyanne Co, PT  Acute Rehab Services  [phone]   Lyanne Co 03/08/2012, 11:13 AM

## 2012-03-08 NOTE — Evaluation (Signed)
Occupational Therapy Evaluation Patient Details Name: Katelyn [name] MRN: [id number] DOB: 1955-03-21 Today's Date: 03/08/2012 Time: 0865-7846 OT Time Calculation (min): 26 min  OT Assessment / Plan / Recommendation Clinical Impression  Pt demos decline in function with ADLs, strength, balance, safety and activity tolerance following R knee suregry. Pt would benefit from OT services to address these impairments to restore PLOF to return home    OT Assessment  Patient needs continued OT Services    Follow Up Recommendations  SNF;Other (comment) (Pt plans to d/c to SNF after acute care stay)    Barriers to Discharge Decreased caregiver support Pt's son works and would not be able to provide adequate care at home pt would need at this time  Equipment Recommendations  Other (comment) (TBD at SNF level of care)    Recommendations for Other Services    Frequency  Min 2X/week    Precautions / Restrictions Precautions Precautions: Knee;Fall Restrictions Weight Bearing Restrictions: Yes RLE Weight Bearing: Weight bearing as tolerated       ADL  Grooming: Performed;Wash/dry face;Supervision/safety;Set up Where Assessed - Grooming: Supported sitting Upper Body Bathing: Simulated;Supervision/safety;Set up Where Assessed - Upper Body Bathing: Supine, head of bed up Lower Body Bathing: +1 Total assistance Upper Body Dressing: Performed;Supervision/safety;Set up Where Assessed - Upper Body Dressing: Supine, head of bed up Lower Body Dressing: +1 Total assistance Toilet Transfer: Moderate assistance (unable to fully assess, transferred chair - bed) Transfers/Ambulation Related to ADLs: OT and PT assisted pt back to bed from recliner ADL Comments: Pt with fluctuating O2 SAT readings while in recliner 42, nrsg notified and entered room to check pt, O2 SATs still low at 29%.  O2 SATs increased to 84 - 90% after pt returned to supine in bed with 2 L O2    OT Diagnosis: Generalized weakness   OT Problem List: Decreased strength;Decreased safety awareness;Decreased activity tolerance;Decreased knowledge of use of DME or AE;Decreased knowledge of precautions;Impaired balance (sitting and/or standing);Cardiopulmonary status limiting activity OT Treatment Interventions: Self-care/ADL training;Therapeutic activities;Therapeutic exercise;Neuromuscular education;DME and/or AE instruction;Patient/family education;Balance training   OT Goals Acute Rehab OT Goals OT Goal Formulation: With patient Time For Goal Achievement: 03/15/12 Potential to Achieve Goals: Good ADL Goals Pt Will Perform Lower Body Bathing: with mod assist;with max assist;with adaptive equipment;Sitting, chair;Sitting, edge of bed ADL Goal: Lower Body Bathing - Progress: Goal set today Pt Will Perform Lower Body Dressing: with mod assist;with max assist;with adaptive equipment;Sitting, chair;Sitting, bed ADL Goal: Lower Body Dressing - Progress: Goal set today Pt Will Transfer to Toilet: with min assist;with DME ADL Goal: Toilet Transfer - Progress: Goal set today Pt Will Perform Toileting - Clothing Manipulation: with mod assist;Standing ADL Goal: Toileting - Clothing Manipulation - Progress: Goal set today Pt Will Perform Toileting - Hygiene: with mod assist;with max assist ADL Goal: Toileting - Hygiene - Progress: Goal set today  Visit Information  Last OT Received On: 03/08/12 Assistance Needed: +2 PT/OT Co-Evaluation/Treatment:  (eval limited due to pt's lethargy and low O2 SATs) Reason Eval/Treat Not Completed: Other (comment);Fatigue/lethargy limiting ability to participate (Lethargy and decreased O2 SATs limiting eval)    Subjective Data  Subjective: " I am tired " Patient Stated Goal: To return home   Prior Functioning     Home Living Lives With: Son (son works) Available Help at Discharge: Family;Available PRN/intermittently Type of Home: Apartment Home Access: Stairs to enter Entrance  Stairs-Number of Steps: 11 Entrance Stairs-Rails: Right;Left Home Layout: One level Bathroom Shower/Tub: Engineer, manufacturing systems:  Standard Bathroom Accessibility: Yes How Accessible: Accessible via walker Home Adaptive Equipment: Bedside commode/3-in-1;Straight cane;Walker - rolling Prior Function Level of Independence: Independent Able to Take Stairs?: Yes Driving: No Vocation: Full time employment Communication Communication: No difficulties Dominant Hand: Right         Vision/Perception Vision - History Baseline Vision: Wears glasses all the time Patient Visual Report: No change from baseline Vision - Assessment Eye Alignment: Within Functional Limits Perception Perception: Within Functional Limits   Cognition  Cognition Overall Cognitive Status: Appears within functional limits for tasks assessed/performed Arousal/Alertness: Lethargic Orientation Level: Appears intact for tasks assessed Behavior During Session: [facility] for tasks performed    Extremity/Trunk Assessment Right Upper Extremity Assessment RUE ROM/Strength/Tone: [facility] for tasks assessed Left Upper Extremity Assessment LUE ROM/Strength/Tone: WFL for tasks assessed     Mobility Bed Mobility Bed Mobility: Sit to Supine;Scooting to HOB Sit to Supine: 1: +1 Total assist Scooting to HOB: 1: +2 Total assist Transfers Transfers: Sit to Stand;Stand to Sit Sit to Stand: 1: +2 Total assist Stand to Sit: 1: +2 Total assist     Exercise     Balance Balance Balance Assessed: No   End of Session OT - End of Session Equipment Utilized During Treatment: Gait belt;Other (comment) (RW, ADL A/E) Activity Tolerance: Patient limited by fatigue;Treatment limited secondary to medical complications (Comment);Other (comment) (O2 SATs, lethargy) Patient left: in bed;with call bell/phone within reach  GO     Galen Manila 03/08/2012, 3:36 PM

## 2012-03-08 NOTE — Plan of Care (Signed)
Problem: Phase II Progression Outcomes Goal: Discharge plan established Recommend SNF for further therapy needs after acute care d/c     

## 2012-03-08 NOTE — Progress Notes (Signed)
Physical Therapy Treatment Patient Details Name: Katelyn [name] MRN: [id number] DOB: 1955/07/19 Today's Date: 03/08/2012 Time: 1191-4782 PT Time Calculation (min): 23 min  PT Assessment / Plan / Recommendation Comments on Treatment Session  Pt still experiencing medical complications that are limiting progression with therapy. This afternoon, she was very lethargic and was found to have O2 sats in the low 40's on RA. 3L O2 applied and O2 sats increased to low 90's. Pt still getting dizzy with standing.  Will continue to attempt mobility progression as pt able to tolerate     Follow Up Recommendations  SNF     Does the patient have the potential to tolerate intense rehabilitation     Barriers to Discharge        Equipment Recommendations  None recommended by PT    Recommendations for Other Services    Frequency 7X/week   Plan Discharge plan remains appropriate;Frequency remains appropriate    Precautions / Restrictions Precautions Precautions: Knee;Fall Restrictions Weight Bearing Restrictions: Yes RLE Weight Bearing: Weight bearing as tolerated   Pertinent Vitals/Pain No c/o pain BP 120/62 in sitting, O2 sats in low 40's on RA, 3L O2 applied and increased to low 90s and pt left with incentive spirometer to work with.  CPM left on 0-85 degrees    Mobility  Bed Mobility Bed Mobility: Sit to Supine;Scooting to HOB Sit to Supine: 1: +1 Total assist Scooting to HOB: 1: +2 Total assist Scooting to [facility]: Patient Percentage: 40% Details for Bed Mobility Assistance: min A to assist right LE and pt very lethargic so she attempted to assist with scoot to [facility] but still needed +2 assist, pt 40% with left leg Transfers Transfers: Sit to Stand;Stand to Sit Sit to Stand: 1: +2 Total assist Stand to Sit: 1: +2 Total assist Stand Pivot Transfers: 1: +2 Total assist Stand Pivot Transfers: Patient Percentage: 50% Details for Transfer Assistance: mod A needed due to pt lethargy for sit to  stand, 2 person assist for safety and pt feeling very "weak" with standing and dizziny again Ambulation/Gait Ambulation/Gait Assistance: Not tested (comment) (medical issues) Stairs: No Wheelchair Mobility Wheelchair Mobility: No    Exercises Total Joint Exercises Ankle Circles/Pumps: AROM;15 reps;Both;Seated Quad Sets: AROM;10 reps;Right;Seated Hip ABduction/ADduction: AAROM;10 reps;Right;Seated   PT Diagnosis:    PT Problem List:   PT Treatment Interventions:     PT Goals Acute Rehab PT Goals PT Goal Formulation: With patient Time For Goal Achievement: 03/13/12 Potential to Achieve Goals: Good Pt will go Supine/Side to Sit: Independently;with HOB 0 degrees PT Goal: Supine/Side to Sit - Progress: Progressing toward goal Pt will go Sit to Supine/Side: Independently;with HOB 0 degrees PT Goal: Sit to Supine/Side - Progress: Progressing toward goal Pt will go Sit to Stand: with supervision;with upper extremity assist PT Goal: Sit to Stand - Progress: Progressing toward goal Pt will Ambulate: 51 - 150 feet;with supervision;with rolling walker PT Goal: Ambulate - Progress: Not progressing Pt will Go Up / Down Stairs: 3-5 stairs;with min assist;with rail(s);with least restrictive assistive device PT Goal: Up/Down Stairs - Progress: Not progressing Pt will Perform Home Exercise Program: with supervision, verbal cues required/provided PT Goal: Perform Home Exercise Program - Progress: Progressing toward goal  Visit Information  Last PT Received On: 03/08/12 Assistance Needed: +2 (safety) PT/OT Co-Evaluation/Treatment: Yes    Subjective Data  Subjective: I just don't feel well Patient Stated Goal: To be able to go home.   Cognition  Cognition Overall Cognitive Status: Appears within functional  limits for tasks assessed/performed Area of Impairment: Other (comment) Arousal/Alertness: Lethargic Orientation Level: Appears intact for tasks assessed Behavior During Session: [facility]  for tasks performed Cognition - Other Comments: pt lethargic this afternoon with appropriate but decreased verbalization, found O2 sats to be very low and RN notified, O2 placed on pt and some improvement in mentation    Balance  Balance Balance Assessed: No  End of Session PT - End of Session Equipment Utilized During Treatment: Gait belt Activity Tolerance: Treatment limited secondary to medical complications (Comment) (decreased O2 sats) Patient left: in bed;in CPM;with call bell/phone within reach;with nursing in room Nurse Communication: Other (comment) (O2 sats)   GP   Lyanne Co, PT  Acute Rehab Services  [phone]   Lyanne Co 03/08/2012, 3:36 PM

## 2012-03-08 NOTE — Progress Notes (Signed)
SPORTS MEDICINE AND JOINT REPLACEMENT  Georgena Spurling, MD   Altamese Cabal, PA-C [address] [place], [place], [place]  [address]                             [phone]   PROGRESS NOTE  Subjective:  negative for Chest Pain  negative for Shortness of Breath  negative for Nausea/Vomiting   negative for Calf Pain  negative for Bowel Movement   Tolerating Diet: yes         Patient reports pain as 6 on 0-10 scale.    Objective: Vital signs in last 24 hours:   Patient Vitals for the past 24 hrs:  BP Temp Temp src Pulse Resp SpO2  03/08/12 1050 102/57 mmHg - - - - -  03/08/12 1045 97/44 mmHg - - - - -  03/08/12 2841 123/55 mmHg 101.3 F (38.5 C) - 100  16  95 %  03/07/12 2043 126/71 mmHg 98 F (36.7 C) Oral 77  16  95 %  03/07/12 1500 112/90 mmHg 97.8 F (36.6 C) - 56  16  100 %    @flow {1959:LAST@   Intake/Output from previous day:   02/04 0701 - 02/05 0700 In: 350 [P.O.:350] Out: -    Intake/Output this shift:       Intake/Output      02/04 0701 - 02/05 0700 02/05 0701 - 02/06 0700   P.O. 350    I.V.     Total Intake 350    Urine     Drains     Blood     Total Output     Net +350         Urine Occurrence 4 x       LABORATORY DATA:  Basename 03/08/12 0510 03/07/12 0500 03/06/12 1801  WBC 13.7* 13.3* 15.2*  HGB 10.8* 11.5* 12.7  HCT 33.7* 35.0* 38.9  PLT 256 253 262    Basename 03/08/12 0510 03/07/12 0500 03/06/12 1801  NA 136 137 --  K 3.6 3.7 --  CL 99 99 --  CO2 30 28 --  BUN 14 20 --  CREATININE 0.92 0.87 0.94  GLUCOSE 169* 236* --  CALCIUM 8.4 8.5 --   Lab Results  Component Value Date   INR 0.97 02/25/2012    Examination:  General appearance: alert, cooperative and no distress Extremities: Homans sign is negative, no sign of DVT  Wound Exam: clean, dry, intact   Drainage:  None: wound tissue dry  Motor Exam: EHL and FHL Intact  Sensory Exam: Deep Peroneal normal   Assessment:    2 Days Post-Op  Procedure(s) (LRB): TOTAL  KNEE REVISION (Right)  ADDITIONAL DIAGNOSIS:  Active Problems:  * No active hospital problems. *   Acute Blood Loss Anemia   Plan: Physical Therapy as ordered Weight Bearing as Tolerated (WBAT)  DVT Prophylaxis:  Lovenox  DISCHARGE PLAN: Home vs SNF  DISCHARGE NEEDS: HHPT, Walker and 3-in-1 comode seat         Anda Sobotta 03/08/2012, 12:41 PM

## 2012-03-08 NOTE — Op Note (Signed)
NAMECHENEL, WERNLI            ACCOUNT NO.:  [id number]  MEDICAL RECORD NO.:  [id number]  LOCATION:  [id number]                        FACILITY:  [facility]  PHYSICIAN:  Mila Homer. Sherlean Foot, M.D. DATE OF BIRTH:  Nov 04, 1955  DATE OF PROCEDURE:  03/06/2012 DATE OF DISCHARGE:                              OPERATIVE REPORT   SURGEON:  Mila Homer. Sherlean Foot, MD  ASSISTANT:  Skip Mayer [facility]  ANESTHESIA:  General.  PREOPERATIVE DIAGNOSIS:  Right knee failed total knee arthroplasty.  PROCEDURE:  Right revision total knee arthroplasty.  INDICATION FOR PROCEDURE:  The patient is a 56 year old black female with failure of a right knee done by Dr. __________ years ago with radiographic evidence of loosening.  Informed consent was obtained.  DESCRIPTION OF PROCEDURE:  The patient was laid supine and administered general anesthesia.  Right leg prepped and draped in usual sterile fashion.  Inferolateral and inferomedial portals were created with #11 blade, blunt trocar, and cannula.  The leg was exsanguinated with the Esmarch and tourniquet inflated to 350 mmHg.  I then made a midline incision over the old incision.  Used a new blade to make a median parapatellar arthrotomy.  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  Complete synovectomy was performed.  I then removed the polyethylene.  I used standard technique with a small __________ osteotome to remove the femoral component.  I then removed the tibial component, which was grossly loose, I just had to let it out.  I then proceeded to curette and rongeur to assist in tibia.  There was a contained type B defect.  There was more bone loss on the lateral femoral condyle than anywhere else.  __________ lavaged.  I then reamed sequentially to 11 on the tibia and 14 on the femur.  I then used the sagittal saw and sized to size E __________ made those cuts with a small saw.  Then, recreated the femur with a size E __________  augment posterolateral 5-mm augment as well.  Then when I put that trial in place, it was excellent with good bony contact and platform to cement into.  Then, removed the trial components.  Tourniquet placed to the tibia.  I did use the intramedullary guide to recut the tibia to get nice platform.  I used a size 3 tibial tray drilling keel and then was able to trial with a 3 tibia with only 11 stem with the E femur with a 14 stem with a distal 5 augments medially and laterally and a 5-mm augment posterolaterally.  Then, used a regular LPS poly size 14, had good flexion-extension gap balance.  I then removed all the components, copiously irrigated, and used [place] G cement, mixed that up, and tamped the components and removed all excess cement __________ polyethylene.  Left the cement to harden __________.  Left a Hemovac, coming out superolaterally deep the arthrotomy.  Pain catheter coming out superomedial and superficial arthrotomy.  Let tourniquet down, obtained hemostasis, copiously irrigated, closed the arthrotomy with figure-of-eight #1 Vicryl sutures, buried 0 Vicryl sutures, subcuticular 3-0 Vicryl stitches and staples.  Dressed with Xeroform, dry sponges, sterile Webril, and Ace wrap.  COMPLICATIONS:  None.  DRAINS:  None.  ______________________________ Mila Homer Sherlean Foot, M.D.     SDL/MEDQ  D:  03/07/2012  T:  03/07/2012  Job:  [id number]

## 2012-03-09 LAB — BASIC METABOLIC PANEL
BUN: 15 mg/dL (ref 6–23)
CO2: 31 mEq/L (ref 19–32)
Chloride: 97 mEq/L (ref 96–112)
Creatinine, Ser: 0.87 mg/dL (ref 0.50–1.10)
Glucose, Bld: 159 mg/dL — ABNORMAL HIGH (ref 70–99)
Potassium: 3.5 mEq/L (ref 3.5–5.1)

## 2012-03-09 LAB — CBC
HCT: 29.3 % — ABNORMAL LOW (ref 36.0–46.0)
Hemoglobin: 9.7 g/dL — ABNORMAL LOW (ref 12.0–15.0)
MCV: 76.9 fL — ABNORMAL LOW (ref 78.0–100.0)
RBC: 3.81 MIL/uL — ABNORMAL LOW (ref 3.87–5.11)
WBC: 13.2 10*3/uL — ABNORMAL HIGH (ref 4.0–10.5)

## 2012-03-09 NOTE — Progress Notes (Signed)
Seen and agreed 03/09/2012 Robinette, Julia Elizabeth PTA [phone] pager [phone] office    

## 2012-03-09 NOTE — Progress Notes (Signed)
Physical Therapy Treatment Patient Details Name: Katelyn [name] MRN: [id number] DOB: 01-07-1956 Today's Date: 03/09/2012 Time: 7829-5621 PT Time Calculation (min): 28 min  PT Assessment / Plan / Recommendation Comments on Treatment Session  Pt presenting in imporved medical condition this session. Increased activity tolerance.  In room ambulation in order to monitor O2 sats. Pt able to maintain normal O2 sats throughtout with 3L. NO c/o of dizziness. Will continue tp progress as tolerable.    Follow Up Recommendations  SNF                 Equipment Recommendations  None recommended by PT    Recommendations for Other Services    Frequency 7X/week   Plan Discharge plan remains appropriate;Frequency remains appropriate    Precautions / Restrictions Precautions Precautions: Knee Restrictions Weight Bearing Restrictions: Yes RLE Weight Bearing: Weight bearing as tolerated   Pertinent Vitals/Pain O2 sats 96-99 with 3L.    Mobility  Transfers Sit to Stand: 4: Min guard Stand to Sit: 4: Min guard Details for Transfer Assistance: Demos good technique for transfers. Ambulation/Gait Ambulation/Gait Assistance: 4: Min assist Ambulation Distance (Feet): 18 Feet (x3) Assistive device: Rolling walker Ambulation/Gait Assistance Details: Cueing to maintain upright posture. Chair follow for first  44ft attempt. Continued two more without chair follow.  Gait velocity: decreased    Exercises Total Joint Exercises Quad Sets: AROM;Both;15 reps Heel Slides: AAROM;Right;10 reps Hip ABduction/ADduction: AAROM;Right;10 reps Long Arc Quad: AAROM;Right;10 reps   PT Diagnosis:    PT Problem List:   PT Treatment Interventions:     PT Goals Acute Rehab PT Goals PT Goal: Sit to Stand - Progress: Progressing toward goal PT Goal: Ambulate - Progress: Progressing toward goal PT Goal: Perform Home Exercise Program - Progress: Progressing toward goal  Visit Information  Last PT Received On:  03/09/12 Assistance Needed: +1    Subjective Data      Cognition  Cognition Overall Cognitive Status: Appears within functional limits for tasks assessed/performed Arousal/Alertness: Awake/alert Orientation Level: Appears intact for tasks assessed Behavior During Session: [facility] for tasks performed    Balance     End of Session PT - End of Session Equipment Utilized During Treatment: Gait belt Activity Tolerance: Patient tolerated treatment well Patient left: in chair;with call bell/phone within reach CPM Right Knee CPM Right Knee: Off   GP     Lazaro Arms 03/09/2012, 10:23 AM

## 2012-03-09 NOTE — Progress Notes (Signed)
Seen and agreed 03/09/2012 Fredrich Birks PTA [phone] pager [phone] office

## 2012-03-10 NOTE — Discharge Summary (Signed)
SPORTS MEDICINE & JOINT REPLACEMENT   Katelyn Spurling, MD   Altamese Cabal, PA-C [address] [place], [place], [place]  [address]                             [phone]  PATIENT ID: Katelyn [name]        MRN:  [id number]          DOB/AGE: 58-16-1957 / 57 y.o.    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 *   Past Medical History  Diagnosis Date  . Sleep apnea     never got machine  . Hypertension   . Heart murmur   . Seasonal allergies   . Tuberculosis 1997  . Arthritis     PROCEDURE: Procedure(s): TOTAL KNEE REVISION on 03/06/2012  CONSULTS:     HISTORY:  See H&P in chart  HOSPITAL COURSE:  Katelyn [name] is a 57 y.o. 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  After appropriate laboratory studies were obtained  they were taken to the operating room on 03/06/2012 and underwent Procedure(s): TOTAL KNEE REVISION.   They were given perioperative antibiotics:  Anti-infectives     Start     Dose/Rate Route Frequency Ordered Stop   03/06/12 1730   vancomycin (VANCOCIN) IVPB 1000 mg/200 mL premix        1,000 mg 200 mL/hr over 60 Minutes Intravenous Every 12 hours 03/06/12 1722 03/06/12 1935   03/06/12 0600   vancomycin (VANCOCIN) 1,500 mg in sodium chloride 0.9 % 500 mL IVPB  Status:  Discontinued        1,500 mg 250 mL/hr over 120 Minutes Intravenous On call to O.R. 03/05/12 1218 03/06/12 1701        .  Tolerated the procedure well.  Placed with a foley intraoperatively.  Given Ofirmev at induction and for 48 hours.    POD# 1: Vital signs were stable.  Patient denied Chest pain, shortness of breath, or calf pain.  Patient was started on  Lovenox 30 mg subcutaneously twice daily at 8am.  Consults to PT, OT, and care management were made.  The patient was weight bearing as tolerated.  CPM was placed on the operative leg 0-90 degrees for 6-8 hours a day.  Incentive spirometry was taught.  Dressing was changed.  Marcaine pump and hemovac were discontinued.      POD #2, Continued  PT for ambulation and exercise program.  IV saline locked.  O2 discontinued.    The remainder of the hospital course was dedicated to ambulation and strengthening.   The patient was discharged on 4 Days Post-Op in  Good condition.  Blood products given:none  DIAGNOSTIC STUDIES: Recent vital signs: Patient Vitals for the past 24 hrs:  BP Temp Temp src Pulse Resp SpO2  03/10/12 0800 - - - - 16  97 %  03/10/12 0536 147/77 mmHg 98.4 F (36.9 C) - 96  18  97 %  03/09/12 2157 143/65 mmHg 98.3 F (36.8 C) Oral 90  18  100 %  03/09/12 1659 - - - - - 96 %  03/09/12 1400 98/64 mmHg  97.9 F (36.6 C) - 86  17  98 %       Recent laboratory studies:  Basename 03/09/12 0530 04/05/12 0510 03/07/12 0500 03/06/12 1801  WBC 13.2* 13.7* 13.3* 15.2*  HGB 9.7* 10.8* 11.5* 12.7  HCT 29.3* 33.7* 35.0* 38.9  PLT 208 256 253 262    Basename 03/09/12 0530 Apr 05, 2012 0510 03/07/12 0500 03/06/12 1801  NA 134* 136 137 --  K 3.5 3.6 3.7 --  CL 97 99 99 --  CO2 31 30 28  --  BUN 15 14 20  --  CREATININE 0.87 0.92 0.87 0.94  GLUCOSE 159* 169* 236* --  CALCIUM 8.4 8.4 8.5 --   Lab Results  Component Value Date   INR 0.97 02/25/2012     Recent Radiographic Studies :  Dg Chest 2 View  02/25/2012  *RADIOLOGY REPORT*  Clinical Data: Heart murmur, hypertension, preop knee surgery  CHEST - 2 VIEW  Comparison: 11/15/2007  Findings: Relatively low lung volumes.  New cardiomegaly.  Crowding of bronchovascular structures in both lung bases with question of mild central pulmonary vascular congestion.  No effusion.  Spurring in the lower thoracic spine.  IMPRESSION: 1.  New  cardiomegaly. 2.  Low volumes with bibasilar subsegmental atelectasis.   Original Report Authenticated By: D. Andria Rhein, MD    Dg Chest Port 1 View  Apr 05, 2012  *RADIOLOGY REPORT*  Clinical Data: Desaturation.  PORTABLE CHEST - 1 VIEW  Comparison: 02/25/2012.  Findings: Trachea is midline.  Heart size within normal limits. Probable linear scarring in the lingula.  Lungs are otherwise clear.  No pleural fluid.  IMPRESSION: No acute findings.   Original Report Authenticated By: Leanna Battles, M.D.    Dg Knee Right Port  03/06/2012  *RADIOLOGY REPORT*  Clinical Data: Postop right knee revision.  PORTABLE RIGHT KNEE - 1-2 VIEW  Comparison: None.  Findings: There are postoperative changes of right knee arthroplasty.  Subcutaneous and joint air and fluid are present. Surgical drain and skin staples are in place.  IMPRESSION: Right knee arthroplasty with expected postoperative findings.   Original Report Authenticated By: Leanna Battles, M.D.     DISCHARGE INSTRUCTIONS: Discharge Orders    Future Orders Please Complete By Expires   Diet - low sodium heart healthy      Call MD / Call 911      Comments:   If you experience chest pain or shortness of breath, CALL 911 and be transported to the hospital emergency room.  If you develope a fever above 101 F, pus (white drainage) or increased drainage or redness at the wound, or calf pain, call your surgeon's office.   Constipation Prevention      Comments:   Drink plenty of fluids.  Prune juice may be helpful.  You may use a stool softener, such as Colace (over the counter) 100 mg twice a day.  Use MiraLax (over the counter) for constipation as needed.   Increase activity slowly as tolerated      Weight bearing as tolerated      Driving restrictions      Comments:   No driving for 6 weeks   Lifting restrictions      Comments:   No lifting for 6 weeks   CPM      Comments:   Continuous passive motion machine (CPM):      Use the CPM from 0 to 90 for  6-8 hours per day.      You may increase by 10 per  day.  You may break it up into 2 or 3 sessions per day.      Use CPM for 2 weeks or until you are told to stop.   TED hose      Comments:   Use stockings (TED hose) for 3 weeks on both leg(s).  You may remove them at night for sleeping.   Change dressing      Comments:   Change dressing on saturday, then change the dressing daily with sterile 4 x 4 inch gauze dressing and apply TED hose.  You may clean the incision with alcohol prior to redressing.   Do not put a pillow under the knee. Place it under the heel.         DISCHARGE MEDICATIONS:     Medication List     As of 03/10/2012 12:29 PM    STOP taking these medications         aspirin EC 81 MG tablet      OMEGA 3-6-9 COMPLEX PO      TAKE these medications         cholecalciferol 400 UNITS Tabs   Commonly known as: VITAMIN D   Take 400 Units by mouth daily.      DUTOPROL 100-12.5 MG Tb24   Generic drug: Metoprolol-Hydrochlorothiazide   Take 1 tablet by mouth daily.      enoxaparin 40 MG/0.4ML injection   Commonly known as: LOVENOX   Inject 0.4 mLs (40 mg total) into the skin daily.      HAIR/SKIN/NAILS PO   Take 1 tablet by mouth daily.      loratadine-pseudoephedrine 10-240 MG per 24 hr tablet   Commonly known as: CLARITIN-D 24-hour   Take 1 tablet by mouth daily.      methocarbamol 500 MG tablet   Commonly known as: ROBAXIN   Take 1 tablet (500 mg total) by mouth every 6 (six) hours as needed.      multivitamin with minerals Tabs   Take 1 tablet by mouth daily.      oxyCODONE 5 MG immediate release tablet   Commonly known as: Oxy IR/ROXICODONE   Take 1-2 tablets (5-10 mg total) by mouth every 3 (three) hours as needed.        FOLLOW UP VISIT:       Follow-up Information    Follow up with Raymon Mutton, MD. Call on 03/21/2012.   Contact information:   [address] [place] [address] [phone]          DISPOSITION:  SNF  CONDITION:  Good  Declan Adamson 03/10/2012, 12:29 PM

## 2012-03-10 NOTE — Progress Notes (Signed)
Pt was ambulating in hall with PT, pulse ox was administered d/t continuous pulse ox was reading 240 HR.  Pt sat down in chair, apical pulse taken 119.  02 sats 100% on 2L via [place].  Pt reports no SOB, dizziness or chest pain.  MD to be notified.

## 2012-03-10 NOTE — Progress Notes (Signed)
MD notified re; HR, MD will be at unit today.  Pt reports no SOB, chest pain or dizziness.  HR 81, 02 sats 100% on 2L via [place].  Will continue to monitor for status changes.

## 2012-03-10 NOTE — Progress Notes (Signed)
Physical Therapy Treatment Patient Details Name: Katelyn [name] MRN: [id number] DOB: 1955-02-26 Today's Date: 03/10/2012 Time: 3086-5784 PT Time Calculation (min): 28 min  PT Assessment / Plan / Recommendation Comments on Treatment Session  Patient able to walk in hallway this morning on 3L of O2 and remained 100% O2. Upon walking patients HR into the 140s and patient return to seated position and RN made aware. Will continue to monitor and progress patient as tolerated    Follow Up Recommendations  SNF     Does the patient have the potential to tolerate intense rehabilitation     Barriers to Discharge        Equipment Recommendations  None recommended by PT    Recommendations for Other Services    Frequency 7X/week   Plan Discharge plan remains appropriate;Frequency remains appropriate    Precautions / Restrictions Restrictions RLE Weight Bearing: Weight bearing as tolerated   Pertinent Vitals/Pain Patient remain 100% O2 on 3L throughout. HR increased to 140s, RN aware and to page MD    Mobility  Transfers Sit to Stand: 4: Min guard;With upper extremity assist;From chair/3-in-1;From toilet Stand to Sit: 4: Min guard;With upper extremity assist;To chair/3-in-1;To toilet Details for Transfer Assistance: MinGuard for safety. Patient with correct technique Ambulation/Gait Ambulation/Gait Assistance: 4: Min guard Ambulation Distance (Feet): 60 Feet Assistive device: Rolling walker Ambulation/Gait Assistance Details: Cues for posture and safe use of RW Gait Pattern: Step-to pattern;Trunk flexed Gait velocity: decreased    Exercises Total Joint Exercises Quad Sets: AROM;Both;15 reps Heel Slides: AAROM;Right;10 reps Hip ABduction/ADduction: AAROM;Right;10 reps Straight Leg Raises: AAROM;Right;10 reps Long Arc Quad: AAROM;Right;10 reps   PT Diagnosis:    PT Problem List:   PT Treatment Interventions:     PT Goals Acute Rehab PT Goals PT Goal: Sit to Stand - Progress:  Progressing toward goal PT Goal: Ambulate - Progress: Progressing toward goal PT Goal: Up/Down Stairs - Progress: Progressing toward goal PT Goal: Perform Home Exercise Program - Progress: Progressing toward goal  Visit Information  Last PT Received On: 03/10/12 Assistance Needed: +1    Subjective Data      Cognition  Cognition Overall Cognitive Status: Appears within functional limits for tasks assessed/performed Arousal/Alertness: Awake/alert Orientation Level: Appears intact for tasks assessed Behavior During Session: [facility] for tasks performed    Balance     End of Session PT - End of Session Equipment Utilized During Treatment: Gait belt;Oxygen Activity Tolerance: Patient tolerated treatment well Patient left: in chair;with call bell/phone within reach Nurse Communication: Mobility status   GP     Fredrich Birks 03/10/2012, 10:51 AM 03/10/2012 Fredrich Birks PTA [phone] pager [phone] office

## 2012-03-10 NOTE — Progress Notes (Signed)
Disregard progress note prior, charted on wrong patient.

## 2012-03-10 NOTE — Progress Notes (Signed)
SPORTS MEDICINE AND JOINT REPLACEMENT  Georgena Spurling, MD   Altamese Cabal, PA-C [address] [place], [place], [place]  [address]                             [phone]   PROGRESS NOTE  Subjective:  negative for Chest Pain  negative for Shortness of Breath  negative for Nausea/Vomiting   negative for Calf Pain  negative for Bowel Movement   Tolerating Diet: yes         Patient reports pain as 3 on 0-10 scale.    Objective: Vital signs in last 24 hours:   Patient Vitals for the past 24 hrs:  BP Temp Temp src Pulse Resp SpO2  03/10/12 0800 - - - - 16  97 %  03/10/12 0536 147/77 mmHg 98.4 F (36.9 C) - 96  18  97 %  03/09/12 2157 143/65 mmHg 98.3 F (36.8 C) Oral 90  18  100 %  03/09/12 1659 - - - - - 96 %  03/09/12 1400 98/64 mmHg 97.9 F (36.6 C) - 86  17  98 %    @flow {1959:LAST@   Intake/Output from previous day:   02/06 0701 - 02/07 0700 In: 640 [P.O.:640] Out: -    Intake/Output this shift:   02/07 0701 - 02/07 1900 In: 240 [P.O.:240] Out: -    Intake/Output      02/06 0701 - 02/07 0700 02/07 0701 - 02/08 0700   P.O. 640 240   Total Intake 640 240   Net +640 +240        Urine Occurrence 9 x 1 x      LABORATORY DATA:  Basename 03/09/12 0530 03/08/12 0510 03/07/12 0500 03/06/12 1801  WBC 13.2* 13.7* 13.3* 15.2*  HGB 9.7* 10.8* 11.5* 12.7  HCT 29.3* 33.7* 35.0* 38.9  PLT 208 256 253 262    Basename 03/09/12 0530 03/08/12 0510 03/07/12 0500 03/06/12 1801  NA 134* 136 137 --  K 3.5 3.6 3.7 --  CL 97 99 99 --  CO2 31 30 28  --  BUN 15 14 20  --  CREATININE 0.87 0.92 0.87 0.94  GLUCOSE 159* 169* 236* --  CALCIUM 8.4 8.4 8.5 --   Lab Results  Component Value Date   INR 0.97 02/25/2012    Examination:  General appearance: alert, appears stated age and no distress Extremities: Homans sign is negative, no sign of DVT  Wound Exam: clean, dry, intact   Drainage:  None: wound tissue dry  Motor Exam: EHL and FHL Intact  Sensory Exam: Deep  Peroneal normal   Assessment:    4 Days Post-Op  Procedure(s) (LRB): TOTAL KNEE REVISION (Right)  ADDITIONAL DIAGNOSIS:  Active Problems:  * No active hospital problems. *   Acute Blood Loss Anemia   Plan: Physical Therapy as ordered Weight Bearing as Tolerated (WBAT)  DVT Prophylaxis:  Lovenox  DISCHARGE PLAN: Skilled Nursing Facility/Rehab  DISCHARGE NEEDS: HHPT, CPM, Walker and 3-in-1 comode seat         Kymari Lollis 03/10/2012, 12:24 PM

## 2012-03-10 NOTE — Progress Notes (Signed)
Pt 02 sats 73 to 78% on RA while transferring from [facility] to chair.  As soon as pt sat down, 02 sats immediately went to 92% and above staying at 97%.  No c/o SOB, dizziness or chest pain.  Will continue to monitor.

## 2012-03-10 NOTE — Progress Notes (Signed)
Pt has had flu vaccination this season.  Refuses pneumonia vaccination.

## 2012-11-01 LAB — HM MAMMOGRAPHY

## 2013-04-03 ENCOUNTER — Encounter: Payer: Self-pay | Admitting: Family Medicine

## 2013-04-03 ENCOUNTER — Ambulatory Visit (INDEPENDENT_AMBULATORY_CARE_PROVIDER_SITE_OTHER): Payer: 59 | Admitting: Family Medicine

## 2013-04-03 VITALS — BP 112/80 | HR 72 | Resp 16 | Ht 63.0 in | Wt 224.0 lb

## 2013-04-03 DIAGNOSIS — E785 Hyperlipidemia, unspecified: Secondary | ICD-10-CM

## 2013-04-03 DIAGNOSIS — E559 Vitamin D deficiency, unspecified: Secondary | ICD-10-CM

## 2013-04-03 DIAGNOSIS — R5383 Other fatigue: Secondary | ICD-10-CM

## 2013-04-03 DIAGNOSIS — R7301 Impaired fasting glucose: Secondary | ICD-10-CM

## 2013-04-03 DIAGNOSIS — R5381 Other malaise: Secondary | ICD-10-CM

## 2013-04-03 DIAGNOSIS — D72829 Elevated white blood cell count, unspecified: Secondary | ICD-10-CM

## 2013-04-03 DIAGNOSIS — Z5181 Encounter for therapeutic drug level monitoring: Secondary | ICD-10-CM

## 2013-04-03 DIAGNOSIS — I10 Essential (primary) hypertension: Secondary | ICD-10-CM

## 2013-04-03 MED ORDER — METOPROLOL-HCTZ ER 100-12.5 MG PO TB24: 1.0000 | ORAL_TABLET | Freq: Every day | ORAL | Status: DC

## 2013-04-03 NOTE — Progress Notes (Signed)
Subjective:    Patient ID: Katelyn [name], female    DOB: 11/13/1955, 58 y.o.   MRN: [id number]  HPI  Katelyn [name] is here today to establish care with our practice.  She was referred to us by her friend Roberts Gaudy(Debra Williams).  She has not had a PCP in several years.  She was receiving care by a cardiologist (Dr. Sharyn LullHarwani) for cardiac issues.  She wanted to have a female PCP.  She would like to discuss the condtions listed below:  1)  Hypertension - She has been been on Dutoprol for several years.  Her blood pressure is well controlled with it and she needs a refill on it.     Review of Systems  Constitutional: Negative for activity change, fatigue and unexpected weight change.  HENT: Negative.   Eyes: Negative.   Respiratory: Negative for shortness of breath.   Cardiovascular: Negative for chest pain, palpitations and leg swelling.  Gastrointestinal: Negative for diarrhea and constipation.  Endocrine: Negative.   Genitourinary: Negative for difficulty urinating.  Musculoskeletal: Negative.   Skin: Negative.   Neurological: Negative.   Hematological: Negative for adenopathy. Does not bruise/bleed easily.  Psychiatric/Behavioral: Negative for sleep disturbance and dysphoric mood. The patient is not nervous/anxious.      Past Medical History  Diagnosis Date  . Sleep apnea   . Hypertension   . Heart murmur   . Seasonal allergies   . Arthritis   . Obesity   . Positive PPD, treated     INH x 9 months      Past Surgical History  Procedure Laterality Date  . Knee arthroscopy      bilateral  . Shoulder arthroscopy      right  . Colonoscopy    . Cardiac catheterization      12/14/11 good LV systolic function, mild LVH, LAD has 10-15% proximal and mid stenosis.   . Total knee revision  03/06/2012    Procedure: TOTAL KNEE REVISION;  Surgeon: Dannielle HuhSteve Lucey, MD;  Location: [facility] OR;  Service: Orthopedics;  Laterality: Right;  . 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 Cancer Mother [id number]    Lung   . Hypertension Mother   . Kidney disease Brother     On Dialysis   . Hypertension Brother   . Kidney disease Brother     S/P Kidney Transplant  . Diabetes Brother   . Hypertension Brother   . Hypertension Sister   . Hypertension Sister   . HIV/AIDS Sister      Current Outpatient Prescriptions on File Prior to Visit  Medication Sig Dispense Refill  . loratadine-pseudoephedrine (CLARITIN-D 24-HOUR) 10-240 MG per 24 hr tablet Take 1 tablet by mouth daily.      .Marland Kitchen  Multiple Vitamin (MULTIVITAMIN WITH MINERALS) TABS Take 1 tablet by mouth daily.       No current facility-administered medications on file prior to visit.     Allergies  Allergen Reactions  . Ibuprofen Rash  . Naproxen Rash  . Penicillins Rash    Childhood allergy       Objective:   Physical Exam  Vitals reviewed. Constitutional: She is oriented to person, place, and time.  Eyes: Conjunctivae are normal. No scleral icterus.  Neck: Neck supple. No thyromegaly present.  Cardiovascular: Normal rate, regular rhythm and normal heart sounds.   Pulmonary/Chest: Effort normal and breath sounds normal.  Musculoskeletal: She exhibits no edema and no tenderness.  Lymphadenopathy:    She has no cervical adenopathy.  Neurological: She is alert and oriented to person, place, and time.  Skin: Skin is warm and dry.  Psychiatric: She has a normal mood and  affect. Her behavior is normal. Judgment and thought content normal.      Assessment & Plan:    Ch[place]ille was seen today for establish care.  Diagnoses and associated orders for this visit:  Essential hypertension, benign - Metoprolol-Hydrochlorothiazide (DUTOPROL) 100-12.5 MG TB24; Take 1 tablet by mouth daily.  Other and unspecified hyperlipidemia - NMR Lipoprofile with Lipids  Leukocytosis, unspecified - CBC w/Diff  Encounter for therapeutic drug monitoring - COMPLETE METABOLIC PANEL WITH GFR  Other malaise and fatigue - TSH  Unspecified vitamin D deficiency - Vit D  25 hydroxy (rtn osteoporosis monitoring)  IFG (impaired fasting glucose) - Insulin, fasting - Hemoglobin A1c   TIME SPENT "FACE TO FACE" WITH PATIENT -  30 MINS

## 2013-06-03 DIAGNOSIS — R5381 Other malaise: Secondary | ICD-10-CM | POA: Insufficient documentation

## 2013-06-03 DIAGNOSIS — I10 Essential (primary) hypertension: Secondary | ICD-10-CM | POA: Insufficient documentation

## 2013-06-03 DIAGNOSIS — D72829 Elevated white blood cell count, unspecified: Secondary | ICD-10-CM | POA: Insufficient documentation

## 2013-06-03 DIAGNOSIS — R7301 Impaired fasting glucose: Secondary | ICD-10-CM | POA: Insufficient documentation

## 2013-06-03 DIAGNOSIS — E785 Hyperlipidemia, unspecified: Secondary | ICD-10-CM | POA: Insufficient documentation

## 2013-06-03 DIAGNOSIS — E559 Vitamin D deficiency, unspecified: Secondary | ICD-10-CM | POA: Insufficient documentation

## 2013-06-03 DIAGNOSIS — R5383 Other fatigue: Secondary | ICD-10-CM

## 2013-06-03 DIAGNOSIS — Z5181 Encounter for therapeutic drug level monitoring: Secondary | ICD-10-CM | POA: Insufficient documentation

## 2013-07-09 LAB — CBC WITH DIFFERENTIAL/PLATELET
Basophils Absolute: 0.1 10*3/uL (ref 0.0–0.1)
Basophils Relative: 1 % (ref 0–1)
Eosinophils Absolute: 0.2 10*3/uL (ref 0.0–0.7)
Eosinophils Relative: 3 % (ref 0–5)
HCT: 36.9 % (ref 36.0–46.0)
Hemoglobin: 12.2 g/dL (ref 12.0–15.0)
Lymphocytes Relative: 29 % (ref 12–46)
Lymphs Abs: 2.1 10*3/uL (ref 0.7–4.0)
MCH: 24.4 pg — ABNORMAL LOW (ref 26.0–34.0)
MCHC: 33.1 g/dL (ref 30.0–36.0)
MCV: 73.9 fL — ABNORMAL LOW (ref 78.0–100.0)
Monocytes Absolute: 0.4 10*3/uL (ref 0.1–1.0)
Monocytes Relative: 6 % (ref 3–12)
Neutro Abs: 4.3 10*3/uL (ref 1.7–7.7)
Neutrophils Relative %: 61 % (ref 43–77)
Platelets: 325 10*3/uL (ref 150–400)
RBC: 4.99 MIL/uL (ref 3.87–5.11)
RDW: 16.8 % — ABNORMAL HIGH (ref 11.5–15.5)
WBC: 7.1 10*3/uL (ref 4.0–10.5)

## 2013-07-09 LAB — HEMOGLOBIN A1C
Hgb A1c MFr Bld: 7.8 % — ABNORMAL HIGH (ref <5.7)
Mean Plasma Glucose: 177 mg/dL — ABNORMAL HIGH (ref <117)

## 2013-07-10 LAB — COMPLETE METABOLIC PANEL WITH GFR
ALT: 10 U/L (ref 0–35)
AST: 11 U/L (ref 0–37)
Albumin: 3.6 g/dL (ref 3.5–5.2)
Alkaline Phosphatase: 62 U/L (ref 39–117)
BUN: 19 mg/dL (ref 6–23)
CO2: 26 mEq/L (ref 19–32)
Calcium: 9 mg/dL (ref 8.4–10.5)
Chloride: 101 mEq/L (ref 96–112)
Creat: 0.84 mg/dL (ref 0.50–1.10)
GFR, Est African American: 88 mL/min
GFR, Est Non African American: 76 mL/min
Glucose, Bld: 144 mg/dL — ABNORMAL HIGH (ref 70–99)
Potassium: 3.9 mEq/L (ref 3.5–5.3)
Sodium: 140 mEq/L (ref 135–145)
Total Bilirubin: 0.7 mg/dL (ref 0.2–1.2)
Total Protein: 7 g/dL (ref 6.0–8.3)

## 2013-07-10 LAB — NMR LIPOPROFILE WITH LIPIDS
Cholesterol, Total: 179 mg/dL (ref <200)
HDL Particle Number: 33.2 umol/L (ref >=30.5)
HDL Size: 10.1 nm (ref >=9.2)
HDL-C: 62 mg/dL (ref >=40)
LDL (calc): 103 mg/dL — ABNORMAL HIGH (ref <100)
LDL Particle Number: 1199 nmol/L — ABNORMAL HIGH (ref <1000)
LDL Size: 20.6 nm (ref >20.5)
LP-IR Score: <25 (ref <=45)
Large HDL-P: 11.5 umol/L (ref >=4.8)
Large VLDL-P: 1.1 nmol/L (ref <=2.7)
Small LDL Particle Number: 483 nmol/L (ref <=527)
Triglycerides: 70 mg/dL (ref <150)
VLDL Size: 38.8 nm (ref <=46.6)

## 2013-07-10 LAB — VITAMIN D 25 HYDROXY (VIT D DEFICIENCY, FRACTURES): Vit D, 25-Hydroxy: 39 ng/mL (ref 30–89)

## 2013-07-10 LAB — INSULIN, FASTING: Insulin fasting, serum: 22 u[IU]/mL (ref 3–28)

## 2013-07-10 LAB — TSH: TSH: 1.7 u[IU]/mL (ref 0.350–4.500)

## 2013-07-19 ENCOUNTER — Ambulatory Visit: Payer: 59 | Admitting: Family Medicine

## 2013-07-25 ENCOUNTER — Encounter: Payer: Self-pay | Admitting: Family Medicine

## 2013-07-25 ENCOUNTER — Ambulatory Visit (INDEPENDENT_AMBULATORY_CARE_PROVIDER_SITE_OTHER): Payer: 59 | Admitting: Family Medicine

## 2013-07-25 VITALS — BP 155/101 | HR 66 | Resp 16 | Ht 63.0 in | Wt 225.0 lb

## 2013-07-25 DIAGNOSIS — E1165 Type 2 diabetes mellitus with hyperglycemia: Secondary | ICD-10-CM

## 2013-07-25 DIAGNOSIS — IMO0001 Reserved for inherently not codable concepts without codable children: Secondary | ICD-10-CM

## 2013-07-25 DIAGNOSIS — N898 Other specified noninflammatory disorders of vagina: Secondary | ICD-10-CM

## 2013-07-25 DIAGNOSIS — R300 Dysuria: Secondary | ICD-10-CM

## 2013-07-25 DIAGNOSIS — I10 Essential (primary) hypertension: Secondary | ICD-10-CM

## 2013-07-25 LAB — POCT URINALYSIS DIPSTICK
Bilirubin, UA: NEGATIVE
Glucose, UA: NEGATIVE
Ketones, UA: NEGATIVE
Nitrite, UA: NEGATIVE
Protein, UA: NEGATIVE
Spec Grav, UA: 1.01
Urobilinogen, UA: NEGATIVE
pH, UA: 6.5

## 2013-07-25 MED ORDER — CIPROFLOXACIN HCL 500 MG PO TABS: 500.0000 mg | ORAL_TABLET | Freq: Two times a day (BID) | ORAL | Status: AC

## 2013-07-25 MED ORDER — METOPROLOL-HCTZ ER 100-12.5 MG PO TB24: 1.0000 | ORAL_TABLET | Freq: Every day | ORAL | Status: DC

## 2013-07-25 MED ORDER — CANAGLIFLOZIN-METFORMIN HCL 150-1000 MG PO TABS: 1.0000 | ORAL_TABLET | Freq: Two times a day (BID) | ORAL | Status: AC

## 2013-07-25 MED ORDER — METRONIDAZOLE 500 MG PO TABS: 500.0000 mg | ORAL_TABLET | Freq: Two times a day (BID) | ORAL | Status: DC

## 2013-07-25 NOTE — Patient Instructions (Signed)
1) Type II Diabetes - Start with Invokamet 50/500 at bedtime for 10 days then go to the 50/1000 at bedtime for 10 days then go to the 50/500 3 times per day with meals then go to the 150/1000 (RX) 2 times per day.    2)  BP - This medication should lower it.  Try to decrease your salt.     Type 2 Diabetes Mellitus, Adult Type 2 diabetes mellitus, often simply referred to as type 2 diabetes, is a long-lasting (chronic) disease. In type 2 diabetes, the pancreas does not make enough insulin (a hormone), the cells are less responsive to the insulin that is made (insulin resistance), or both. Normally, insulin moves sugars from food into the tissue cells. The tissue cells use the sugars for energy. The lack of insulin or the lack of normal response to insulin causes excess sugars to build up in the blood instead of going into the tissue cells. As a result, high blood sugar (hyperglycemia) develops. The effect of high sugar (glucose) levels can cause many complications. Type 2 diabetes was also previously called adult-onset diabetes but it can occur at any age.  RISK FACTORS  A person is predisposed to developing type 2 diabetes if someone in the family has the disease and also has one or more of the following primary risk factors:  Overweight.  An inactive lifestyle.  A history of consistently eating high-calorie foods. Maintaining a normal weight and regular physical activity can reduce the chance of developing type 2 diabetes. SYMPTOMS  A person with type 2 diabetes may not show symptoms initially. The symptoms of type 2 diabetes appear slowly. The symptoms include:  Increased thirst (polydipsia).  Increased urination (polyuria).  Increased urination during the night (nocturia).  Weight loss. This weight loss may be rapid.  Frequent, recurring infections.  Tiredness (fatigue).  Weakness.  Vision changes, such as blurred vision.  Fruity smell to your breath.  Abdominal  pain.  Nausea or vomiting.  Cuts or bruises which are slow to heal.  Tingling or numbness in the hands or feet. DIAGNOSIS Type 2 diabetes is frequently not diagnosed until complications of diabetes are present. Type 2 diabetes is diagnosed when symptoms or complications are present and when blood glucose levels are increased. Your blood glucose level may be checked by one or more of the following blood tests:  A fasting blood glucose test. You will not be allowed to eat for at least 8 hours before a blood sample is taken.  A random blood glucose test. Your blood glucose is checked at any time of the day regardless of when you ate.  A hemoglobin A1c blood glucose test. A hemoglobin A1c test provides information about blood glucose control over the previous 3 months.  An oral glucose tolerance test (OGTT). Your blood glucose is measured after you have not eaten (fasted) for 2 hours and then after you drink a glucose-containing beverage. TREATMENT   You may need to take insulin or diabetes medicine daily to keep blood glucose levels in the desired range.  If you use insulin, you may need to adjust the dosage depending on the carbohydrates that you eat with each meal or snack. The treatment goal is to maintain the before meal blood sugar (preprandial glucose) level at 70-130 mg/dL. HOME CARE INSTRUCTIONS   Have your hemoglobin A1c level checked twice a year.  Perform daily blood glucose monitoring as directed by your health care Katelyn [name].  Monitor urine ketones when you are  ill and as directed by your health care Katelyn [name].  Take your diabetes medicine or insulin as directed by your health care Katelyn [name] to maintain your blood glucose levels in the desired range.  Never run out of diabetes medicine or insulin. It is needed every day.  If you are using insulin, you may need to adjust the amount of insulin given based on your intake of carbohydrates. Carbohydrates can raise blood glucose  levels but need to be included in your diet. Carbohydrates provide vitamins, minerals, and fiber which are an essential part of a healthy diet. Carbohydrates are found in fruits, vegetables, whole grains, dairy products, legumes, and foods containing added sugars.  Eat healthy foods. You should make an appointment to see a registered dietitian to help you create an eating plan that is right for you.  Lose weight if overweight.  Carry a medical alert card or wear your medical alert jewelry.  Carry a 15 gram carbohydrate snack with you at all times to treat low blood glucose (hypoglycemia). Some examples of 15 gram carbohydrate snacks include:  Glucose tablets, 3 or 4  Raisins, 2 tablespoons (24 grams)  Jelly beans, 6  Animal crackers, 8  Regular pop, 4 ounces (120 mL)  Gummy treats, 9  Recognize hypoglycemia. Hypoglycemia occurs with blood glucose levels of 70 mg/dL and below. The risk for hypoglycemia increases when fasting or skipping meals, during or after intense exercise, and during sleep. Hypoglycemia symptoms can include:  Tremors or shakes.  Decreased ability to concentrate.  Sweating.  Increased heart rate.  Headache.  Dry mouth.  Hunger.  Irritability.  Anxiety.  Restless sleep.  Altered speech or coordination.  Confusion.  Treat hypoglycemia promptly. If you are alert and able to safely swallow, follow the 15:15 rule:  Take 15-20 grams of rapid-acting glucose or carbohydrate. Rapid-acting options include glucose gel, glucose tablets, or 4 ounces (120 mL) of fruit juice, regular soda, or low fat milk.  Check your blood glucose level 15 minutes after taking the glucose.  Take 15-20 grams more of glucose if the repeat blood glucose level is still 70 mg/dL or below.  Eat a meal or snack within 1 hour once blood glucose levels return to normal.  Be alert to feeling very thirsty and urinating more frequently than usual, which are early signs of  hyperglycemia. An early awareness of hyperglycemia allows for prompt treatment. Treat hyperglycemia as directed by your health care Katelyn [name].  Engage in at least 150 minutes of moderate-intensity physical activity a week, spread over at least 3 days of the week or as directed by your health care Katelyn [name]. In addition, you should engage in resistance exercise at least 2 times a week or as directed by your health care Katelyn [name].  Adjust your medicine and food intake as needed if you start a new exercise or sport.  Follow your sick day plan at any time you are unable to eat or drink as usual.  Avoid tobacco use.  Limit alcohol intake to no more than 1 drink per day for nonpregnant women and 2 drinks per day for men. You should drink alcohol only when you are also eating food. Talk with your health care Katelyn [name] whether alcohol is safe for you. Tell your health care Katelyn [name] if you drink alcohol several times a week.  Follow up with your health care Katelyn [name] regularly.  Schedule an eye exam soon after the diagnosis of type 2 diabetes and then annually.  Perform daily skin and foot care.  Examine your skin and feet daily for cuts, bruises, redness, nail problems, bleeding, blisters, or sores. A foot exam by a health care Katelyn [name] should be done annually.  Brush your teeth and gums at least twice a day and floss at least once a day. Follow up with your dentist regularly.  Share your diabetes management plan with your workplace or school.  Stay up-to-date with immunizations.  Learn to manage stress.  Obtain ongoing diabetes education and support as needed.  Participate in, or seek rehabilitation as needed to maintain or improve independence and quality of life. Request a physical or occupational therapy referral if you are having foot or hand numbness or difficulties with grooming, dressing, eating, or physical activity. SEEK MEDICAL CARE IF:   You are unable to eat food or drink fluids for  more than 6 hours.  You have nausea and vomiting for more than 6 hours.  Your blood glucose level is over 240 mg/dL.  There is a change in mental status.  You develop an additional serious illness.  You have diarrhea for more than 6 hours.  You have been sick or have had a fever for a couple of days and are not getting better.  You have pain during any physical activity.  SEEK IMMEDIATE MEDICAL CARE IF:  You have difficulty breathing.  You have moderate to large ketone levels. MAKE SURE YOU:  Understand these instructions.  Will watch your condition.  Will get help right away if you are not doing well or get worse. Document Released: 01/18/2005 Document Revised: 01/23/2013 Document Reviewed: 08/17/2011 [facility] Patient Information 2015 [place], [place]. This information is not intended to replace advice given to you by your health care Katelyn [name]. Make sure you discuss any questions you have with your health care Katelyn [name].

## 2013-07-25 NOTE — Progress Notes (Signed)
Subjective:    Patient ID: Katelyn [name], female    DOB: 10/10/1955, 58 y.o.   MRN: [id number]  HPI  Katelyn [name] is here today to follow up on her recent lab results. Her blood pressure is elevated today but she admits to not having taken her blood pressure medication. She also is concerned about a malodorous vaginal discharge for about 2 weeks. She says that when she goes to urinate it feels uncomfortable but it's not like a UTI.    Review of Systems  Constitutional: Negative for activity change, appetite change and fatigue.  Cardiovascular: Positive for leg swelling. Negative for chest pain and palpitations.  Genitourinary: Positive for dysuria and vaginal discharge. Negative for frequency, hematuria, difficulty urinating and vaginal pain.       Vaginal odor  Psychiatric/Behavioral: Negative for behavioral problems and sleep disturbance. The patient is not nervous/anxious.   All other systems reviewed and are negative.    Past Medical History  Diagnosis Date  . Sleep apnea   . Hypertension   . Heart murmur   . Seasonal allergies   . Arthritis   . Obesity   . Positive PPD, treated     INH x 9 months      Past Surgical History  Procedure Laterality Date  . Knee arthroscopy      bilateral  . Shoulder arthroscopy      right  . Colonoscopy    . Cardiac catheterization      12/14/11 good LV systolic function, mild LVH, LAD has 10-15% proximal and mid stenosis.   . Total knee revision  03/06/2012    Procedure: TOTAL KNEE REVISION;  Surgeon: Dannielle HuhSteve Lucey, MD;  Location: [facility] OR;  Service: Orthopedics;  Laterality: Right;  . 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 Cancer Mother [id number]    Lung   . Hypertension Mother   . Kidney disease Brother     On Dialysis   . Hypertension Brother   . Kidney disease Brother     S/P Kidney Transplant  . Diabetes Brother   . Hypertension Brother   . Hypertension Sister   . Cancer Sister     breast  . Hypertension Sister   . HIV/AIDS Sister      Current Outpatient Prescriptions on File Prior to Visit  Medication Sig Dispense Refill  . Multiple Vitamin (MULTIVITAMIN WITH MINERALS) TABS Take 1 tablet by mouth daily.       No current facility-administered medications on file prior to visit.     Allergies  Allergen Reactions  . Ibuprofen Rash  . Naproxen Rash  . Penicillins Rash    Childhood allergy        Objective:   Physical Exam  Vitals reviewed. Constitutional: She appears well-nourished.  Cardiovascular: Normal rate.   Pulmonary/Chest: Effort normal.  Genitourinary: Vaginal discharge found.  Musculoskeletal: Normal range of motion. She exhibits edema.  Neurological: She is alert.  Skin: Skin is warm and dry.  Psychiatric: She has a normal mood and affect. Her behavior is normal. Judgment and thought content normal.      Assessment & Plan:    Katelyn [name] was seen today for medication management.  Diagnoses and associated orders for this visit:  Essential hypertension, benign - Metoprolol-Hydrochlorothiazide (DUTOPROL) 100-12.5 MG TB24; Take 1 tablet by mouth daily.  Vaginal discharge - Discontinue: metroNIDAZOLE (FLAGYL) 500 MG tablet; Take 1 tablet (500 mg total) by mouth 2 (two) times daily with a meal. DO NOT CONSUME ALCOHOL WHILE TAKING THIS MEDICATION.  Dysuria - POCT urinalysis dipstick - Urine Culture - ciprofloxacin (CIPRO) 500 MG  tablet; Take 1 tablet (500 mg total) by mouth 2 (two) times daily.  Type II or unspecified type diabetes mellitus without mention of complication, uncontrolled - Canagliflozin-Metformin HCl [phone] MG TABS; Take 1 tablet by mouth 2 (two) times daily.   TIME SPENT "FACE TO FACE" WITH PATIENT -  30 MINS

## 2013-07-26 LAB — URINE CULTURE: Colony Count: 30000

## 2013-07-31 ENCOUNTER — Emergency Department (HOSPITAL_COMMUNITY)
Admission: EM | Admit: 2013-07-31 | Discharge: 2013-07-31 | Disposition: A | Discharge status: Home / Self Care | Payer: 59 | Attending: Emergency Medicine | Admitting: Emergency Medicine

## 2013-07-31 ENCOUNTER — Encounter (HOSPITAL_COMMUNITY): Payer: Self-pay | Admitting: Emergency Medicine

## 2013-07-31 DIAGNOSIS — A499 Bacterial infection, unspecified: Secondary | ICD-10-CM | POA: Insufficient documentation

## 2013-07-31 DIAGNOSIS — Z880 Allergy status to penicillin: Secondary | ICD-10-CM | POA: Insufficient documentation

## 2013-07-31 DIAGNOSIS — Z79899 Other long term (current) drug therapy: Secondary | ICD-10-CM | POA: Insufficient documentation

## 2013-07-31 DIAGNOSIS — B3731 Acute candidiasis of vulva and vagina: Secondary | ICD-10-CM | POA: Insufficient documentation

## 2013-07-31 DIAGNOSIS — R011 Cardiac murmur, unspecified: Secondary | ICD-10-CM | POA: Insufficient documentation

## 2013-07-31 DIAGNOSIS — B9689 Other specified bacterial agents as the cause of diseases classified elsewhere: Secondary | ICD-10-CM | POA: Insufficient documentation

## 2013-07-31 DIAGNOSIS — N760 Acute vaginitis: Secondary | ICD-10-CM | POA: Insufficient documentation

## 2013-07-31 DIAGNOSIS — E669 Obesity, unspecified: Secondary | ICD-10-CM | POA: Insufficient documentation

## 2013-07-31 DIAGNOSIS — Z7982 Long term (current) use of aspirin: Secondary | ICD-10-CM | POA: Insufficient documentation

## 2013-07-31 DIAGNOSIS — Z792 Long term (current) use of antibiotics: Secondary | ICD-10-CM | POA: Insufficient documentation

## 2013-07-31 DIAGNOSIS — Z8709 Personal history of other diseases of the respiratory system: Secondary | ICD-10-CM | POA: Insufficient documentation

## 2013-07-31 DIAGNOSIS — M129 Arthropathy, unspecified: Secondary | ICD-10-CM | POA: Insufficient documentation

## 2013-07-31 DIAGNOSIS — B373 Candidiasis of vulva and vagina: Secondary | ICD-10-CM | POA: Insufficient documentation

## 2013-07-31 DIAGNOSIS — I10 Essential (primary) hypertension: Secondary | ICD-10-CM | POA: Insufficient documentation

## 2013-07-31 LAB — COMPREHENSIVE METABOLIC PANEL
ALK PHOS: 75 U/L (ref 39–117)
ALT: 15 U/L (ref 0–35)
AST: 15 U/L (ref 0–37)
Albumin: 3.3 g/dL — ABNORMAL LOW (ref 3.5–5.2)
BUN: 25 mg/dL — ABNORMAL HIGH (ref 6–23)
CALCIUM: 10 mg/dL (ref 8.4–10.5)
CO2: 28 meq/L (ref 19–32)
Chloride: 103 mEq/L (ref 96–112)
Creatinine, Ser: 1.04 mg/dL (ref 0.50–1.10)
GFR calc Af Amer: 67 mL/min — ABNORMAL LOW (ref >90)
GFR calc non Af Amer: 58 mL/min — ABNORMAL LOW (ref >90)
GLUCOSE: 135 mg/dL — AB (ref 70–99)
POTASSIUM: 3.4 meq/L — AB (ref 3.7–5.3)
SODIUM: 143 meq/L (ref 137–147)
TOTAL PROTEIN: 7.8 g/dL (ref 6.0–8.3)
Total Bilirubin: 0.5 mg/dL (ref 0.3–1.2)

## 2013-07-31 LAB — URINALYSIS, ROUTINE W REFLEX MICROSCOPIC
Bilirubin Urine: NEGATIVE
GLUCOSE, UA: NEGATIVE mg/dL
Hgb urine dipstick: NEGATIVE
Ketones, ur: NEGATIVE mg/dL
Nitrite: NEGATIVE
PROTEIN: NEGATIVE mg/dL
SPECIFIC GRAVITY, URINE: 1.023 (ref 1.005–1.030)
Urobilinogen, UA: 0.2 mg/dL (ref 0.0–1.0)
pH: 5.5 (ref 5.0–8.0)

## 2013-07-31 LAB — CBC WITH DIFFERENTIAL/PLATELET
BASOS ABS: 0 10*3/uL (ref 0.0–0.1)
Basophils Relative: 1 % (ref 0–1)
EOS ABS: 0.2 10*3/uL (ref 0.0–0.7)
EOS PCT: 3 % (ref 0–5)
HCT: 38.5 % (ref 36.0–46.0)
Hemoglobin: 12.5 g/dL (ref 12.0–15.0)
LYMPHS ABS: 2 10*3/uL (ref 0.7–4.0)
LYMPHS PCT: 24 % (ref 12–46)
MCH: 24.5 pg — ABNORMAL LOW (ref 26.0–34.0)
MCHC: 32.5 g/dL (ref 30.0–36.0)
MCV: 75.5 fL — AB (ref 78.0–100.0)
Monocytes Absolute: 0.7 10*3/uL (ref 0.1–1.0)
Monocytes Relative: 9 % (ref 3–12)
NEUTROS PCT: 63 % (ref 43–77)
Neutro Abs: 5.4 10*3/uL (ref 1.7–7.7)
PLATELETS: 298 10*3/uL (ref 150–400)
RBC: 5.1 MIL/uL (ref 3.87–5.11)
RDW: 16 % — AB (ref 11.5–15.5)
WBC: 8.4 10*3/uL (ref 4.0–10.5)

## 2013-07-31 LAB — URINE MICROSCOPIC-ADD ON

## 2013-07-31 LAB — WET PREP, GENITAL: Trich, Wet Prep: NONE SEEN

## 2013-07-31 MED ORDER — METRONIDAZOLE 500 MG PO TABS: 500.0000 mg | ORAL_TABLET | Freq: Two times a day (BID) | ORAL | Status: DC

## 2013-07-31 MED ORDER — FLUCONAZOLE 150 MG PO TABS
150.0000 mg | ORAL_TABLET | Freq: Once | ORAL | Status: AC
  Administered 2013-07-31: 150 mg via ORAL
  Filled 2013-07-31: qty 1

## 2013-07-31 NOTE — Discharge Instructions (Signed)
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 This requires surgical intervention. You have been referred to a surgeon who can repair this. You have an appointment with your GYN physician on Thursday. Please keep this appointment as it is likely this will be the earliest appointment you can get. Please return to the Emergency Department sooner if you develop a fever, have nausea/vomiting, worsening pain, or any symptom that is concerning to you.   Bacterial Vaginosis Bacterial vaginosis is a vaginal infection that occurs when the normal balance of bacteria in the vagina is disrupted. It results from an overgrowth of certain bacteria. This is the most common vaginal infection in women of childbearing age. Treatment is important to prevent complications, especially in pregnant women, as it can cause a premature delivery. CAUSES  Bacterial vaginosis is caused by an increase in harmful bacteria that are normally present in smaller amounts in the vagina. Several different kinds of bacteria can cause bacterial vaginosis. However, the reason that the condition develops is not fully understood. RISK FACTORS Certain activities or behaviors can put you at an increased risk of developing bacterial vaginosis, including:  Having a new sex partner or multiple sex partners.  Douching.  Using an intrauterine device (IUD) for contraception. Women do not get bacterial vaginosis from toilet seats, bedding, swimming pools, or contact with objects around them. SIGNS AND SYMPTOMS  Some women with bacterial vaginosis have no signs or symptoms. Common symptoms include:  Grey vaginal discharge.  A fishlike odor with discharge, especially after sexual intercourse.  Itching or burning of the vagina and vulva.  Burning or pain with urination. DIAGNOSIS  Your health care provider will take a medical history and examine the vagina for signs of bacterial vaginosis. A sample of vaginal fluid may be taken. Your health care  provider will look at this sample under a microscope to check for bacteria and abnormal cells. A vaginal pH test may also be done.  TREATMENT  Bacterial vaginosis may be treated with antibiotic medicines. These may be given in the form of a pill or a vaginal cream. A second round of antibiotics may be prescribed if the condition comes back after treatment.  HOME CARE INSTRUCTIONS   Only take over-the-counter or prescription medicines as directed by your health care provider.  If antibiotic medicine was prescribed, take it as directed. Make sure you finish it even if you start to feel better.  Do not have sex until treatment is completed.  Tell all sexual partners that you have a vaginal infection. They should see their health care provider and be treated if they have problems, such as a mild rash or itching.  Practice safe sex by using condoms and only having one sex partner. SEEK MEDICAL CARE IF:   Your symptoms are not improving after 3 days of treatment.  You have increased discharge or pain.  You have a fever. MAKE SURE YOU:   Understand these instructions.  Will watch your condition.  Will get help right away if you are not doing well or get worse. FOR MORE INFORMATION  Centers for Disease Control and Prevention, Division of STD Prevention: [employer]s.co.za American Sexual Health Association (ASHA): www.ashastd.org  Document Released: 01/18/2005 Document Revised: 11/08/2012 Document Reviewed: 08/30/2012 [facility] Patient Information 2015 [place], [place]. This information is not intended to replace advice given to you by your health care provider. Make sure you discuss any questions you have with your health care provider.  Candida Infection, Adult A candida infection (also called  yeast, fungus and Monilia infection) is an overgrowth of yeast that can occur anywhere on the body. A yeast infection commonly occurs in warm, moist body areas. Usually, the infection remains localized  but can spread to become a systemic infection. A yeast infection may be a sign of a more severe disease such as diabetes, leukemia, or AIDS. A yeast infection can occur in both men and women. In women, Candida vaginitis is a vaginal infection. It is one of the most common causes of vaginitis. Men usually do not have symptoms or know they have an infection until other problems develop. Men may find out they have a yeast infection because their sex partner has a yeast infection. Uncircumcised men are more likely to get a yeast infection than circumcised men. This is because the uncircumcised glans is not exposed to air and does not remain as dry as that of a circumcised glans. Older adults may develop yeast infections around dentures. CAUSES  Women  Antibiotics.  Steroid medication taken for a long time.  Being overweight (obese).  Diabetes.  Poor immune condition.  Certain serious medical conditions.  Immune suppressive medications for organ transplant patients.  Chemotherapy.  Pregnancy.  Menstration.  Stress and fatigue.  Intravenous drug use.  Oral contraceptives.  Wearing tight-fitting clothes in the crotch area.  Catching it from a sex partner who has a yeast infection.  Spermicide.  Intravenous, urinary, or other catheters. Men  Catching it from a sex partner who has a yeast infection.  Having oral or anal sex with a person who has the infection.  Spermicide.  Diabetes.  Antibiotics.  Poor immune system.  Medications that suppress the immune system.  Intravenous drug use.  Intravenous, urinary, or other catheters. SYMPTOMS  Women  Thick, white vaginal discharge.  Vaginal itching.  Redness and swelling in and around the vagina.  Irritation of the lips of the vagina and perineum.  Blisters on the vaginal lips and perineum.  Painful sexual intercourse.  Low blood sugar (hypoglycemia).  Painful urination.  Bladder infections.  Intestinal  problems such as constipation, indigestion, bad breath, bloating, increase in gas, diarrhea, or loose stools. Men  Men may develop intestinal problems such as constipation, indigestion, bad breath, bloating, increase in gas, diarrhea, or loose stools.  Dry, cracked skin on the penis with itching or discomfort.  Jock itch.  Dry, flaky skin.  Athlete's foot.  Hypoglycemia. DIAGNOSIS  Women  A history and an exam are performed.  The discharge may be examined under a microscope.  A culture may be taken of the discharge. Men  A history and an exam are performed.  Any discharge from the penis or areas of cracked skin will be looked at under the microscope and cultured.  Stool samples may be cultured. TREATMENT  Women  Vaginal antifungal suppositories and creams.  Medicated creams to decrease irritation and itching on the outside of the vagina.  Warm compresses to the perineal area to decrease swelling and discomfort.  Oral antifungal medications.  Medicated vaginal suppositories or cream for repeated or recurrent infections.  Wash and dry the irritation areas before applying the cream.  Eating yogurt with lactobacillus may help with prevention and treatment.  Sometimes painting the vagina with gentian violet solution may help if creams and suppositories do not work. Men  Antifungal creams and oral antifungal medications.  Sometimes treatment must continue for 30 days after the symptoms go away to prevent recurrence. HOME CARE INSTRUCTIONS  Women  Use cotton underwear  and avoid tight-fitting clothing.  Avoid colored, scented toilet paper and deodorant tampons or pads.  Do not douche.  Keep your diabetes under control.  Finish all the prescribed medications.  Keep your skin clean and dry.  Consume milk or yogurt with lactobacillus active culture regularly. If you get frequent yeast infections and think that is what the infection is, there are  over-the-counter medications that you can get. If the infection does not show healing in 3 days, talk to your caregiver.  Tell your sex partner you have a yeast infection. Your partner may need treatment also, especially if your infection does not clear up or recurs. Men  Keep your skin clean and dry.  Keep your diabetes under control.  Finish all prescribed medications.  Tell your sex partner that you have a yeast infection so they can be treated if necessary. SEEK MEDICAL CARE IF:   Your symptoms do not clear up or worsen in one week after treatment.  You have an oral temperature above 102 F (38.9 C).  You have trouble swallowing or eating for a prolonged time.  You develop blisters on and around your vagina.  You develop vaginal bleeding and it is not your menstrual period.  You develop abdominal pain.  You develop intestinal problems as mentioned above.  You get weak or lightheaded.  You have painful or increased urination.  You have pain during sexual intercourse. MAKE SURE YOU:   Understand these instructions.  Will watch your condition.  Will get help right away if you are not doing well or get worse. Document Released: 02/26/2004 Document Revised: 04/12/2011 Document Reviewed: 06/09/2009 [facility] Patient Information 2015 [place], [place]. This information is not intended to replace advice given to you by your health care provider. Make sure you discuss any questions you have with your health care provider.

## 2013-07-31 NOTE — ED Provider Notes (Signed)
CSN: [id number]     Arrival date & time 07/31/13  2024 History   First MD Initiated Contact with Patient 07/31/13 2043     Chief Complaint  Patient presents with  . 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 She reports that this has been ongoing for the past 10 days. She passes brown discharge through her vagina. She denies any fever, chills, nausea, vomiting, abdominal pain. She states that she has an appointment with her OB/GYN physician on Thursday. She is here today because she would like surgery for her fistula. She states "it's just been going on for too long".   Patient is a 58 y.o. female presenting with vaginal discharge. The history is provided by the patient. No language interpreter was used.  Vaginal Discharge Associated symptoms: no abdominal pain, no dysuria, no fever, no nausea and no vomiting     Past Medical History  Diagnosis Date  . Sleep apnea   . Hypertension   . Heart murmur   . Seasonal allergies   . Arthritis   . Obesity   . Positive PPD, treated     INH x 9 months    Past Surgical History  Procedure Laterality Date  . Knee arthroscopy      bilateral  . Shoulder arthroscopy      right  . Colonoscopy    . Cardiac catheterization      12/14/11 good LV systolic function, mild LVH, LAD has 10-15% proximal and mid stenosis.   . Total knee revision  03/06/2012    Procedure: TOTAL KNEE REVISION;  Surgeon: Dannielle HuhSteve Lucey, MD;  Location: [facility] OR;  Service: Orthopedics;  Laterality: Right;  . Abdominal hysterectomy      Uterus Removed (Fibroids)    Family History  Problem Relation Age of Onset  . Cancer Mother 5477    Lung   . Hypertension Mother   . Kidney disease Brother     On Dialysis   . Hypertension Brother   . Kidney disease  Brother     S/P Kidney Transplant  . Diabetes Brother   . Hypertension Brother   . Hypertension Sister   . Hypertension Sister   . HIV/AIDS Sister    History  Substance Use Topics  . Smoking status: Never Smoker   . Smokeless tobacco: Never Used  . Alcohol Use: Yes     Comment: rare   OB History   Grav Para Term Preterm Abortions TAB SAB Ect Mult Living                 Review of Systems  Constitutional: Negative for fever and chills.  Respiratory: Negative for shortness of breath.   Cardiovascular: Negative for chest pain.  Gastrointestinal: Negative for nausea, vomiting, abdominal pain, blood in stool and anal bleeding.  Genitourinary: Positive for vaginal discharge. Negative for dysuria, urgency, frequency, hematuria, flank pain, vaginal bleeding, vaginal pain and pelvic pain.  All other systems reviewed and are negative.     Allergies  Ibuprofen; Naproxen; and Penicillins  Home Medications   Prior to Admission medications   Medication Sig Start Date End Date Taking? Authorizing Provider  aspirin 81 MG chewable tablet Chew 81 mg by mouth every morning.   Yes Historical Provider, MD  cholecalciferol (VITAMIN D) 1000 UNITS tablet Take 1,000 Units by mouth daily.   Yes Historical  Provider, MD  ciprofloxacin (CIPRO) 500 MG tablet Take 1 tablet (500 mg total) by mouth 2 (two) times daily. 07/25/13 08/04/13 Yes Gillian Scarce, MD  loratadine (CLARITIN) 10 MG tablet Take 10 mg by mouth daily.   Yes Historical Provider, MD  Metoprolol-Hydrochlorothiazide (DUTOPROL) 100-12.5 MG TB24 Take 1 tablet by mouth daily. 07/25/13 07/26/14 Yes Gillian Scarce, MD  metroNIDAZOLE (FLAGYL) 500 MG tablet Take 1 tablet (500 mg total) by mouth 2 (two) times daily with a meal. DO NOT CONSUME ALCOHOL WHILE TAKING THIS MEDICATION. 07/25/13  Yes Gillian Scarce, MD  Multiple Vitamin (MULTIVITAMIN WITH MINERALS) TABS Take 1 tablet by mouth daily.   Yes Historical Provider, MD  Canagliflozin-Metformin HCl  [phone] MG TABS Take 1 tablet by mouth 2 (two) times daily. 07/25/13 07/26/14  Gillian Scarce, MD   BP 142/73  Pulse 75  Temp(Src) 98.6 F (37 C) (Oral)  Resp 20  SpO2 95% Physical Exam  Nursing note and vitals reviewed. Constitutional: She is oriented to person, place, and time. She appears well-developed and well-nourished. No distress.  Patient resting in bed comfortably  HENT:  Head: Normocephalic and atraumatic.  Right Ear: External ear normal.  Left Ear: External ear normal.  Nose: Nose normal.  Mouth/Throat: Oropharynx is clear and moist.  Eyes: Conjunctivae are normal.  Neck: Normal range of motion.  Cardiovascular: Normal rate, regular rhythm and normal heart sounds.   Pulmonary/Chest: Effort normal and breath sounds normal. No stridor. No respiratory distress. She has no wheezes. She has no rales.  Abdominal: Soft. Bowel sounds are normal. She exhibits no distension. There is no tenderness. There is no rigidity and no guarding.  Genitourinary: Rectal exam shows no tenderness. There is no rash, tenderness, lesion or injury on the right labia. There is no rash, tenderness, lesion or injury on the left labia. Uterus is not tender. Cervix exhibits discharge. Cervix exhibits no motion tenderness and no friability. Right adnexum displays no mass, no tenderness and no fullness. Left adnexum displays no mass, no tenderness and no fullness. No erythema, tenderness or bleeding around the vagina. No foreign body around the vagina. No signs of injury around the vagina. Vaginal discharge found.  Copious amount of vaginal discharge with stool mixed in vaginal vault. No tenderness with rectal exam. Stool seen on finger, no vaginal discharge on rectal exam.   Musculoskeletal: Normal range of motion.  Neurological: She is alert and oriented to person, place, and time. She has normal strength.  Skin: Skin is warm and dry. She is not diaphoretic. No erythema.  Psychiatric: She has a normal mood  and affect. Her behavior is normal.    ED Course  Procedures (including critical care time) Labs Review Labs Reviewed  WET PREP, GENITAL - Abnormal; Notable for the following:    Yeast Wet Prep HPF POC MANY (*)    Clue Cells Wet Prep HPF POC MANY (*)    WBC, Wet Prep HPF POC MANY (*)    All other components within normal limits  CBC WITH DIFFERENTIAL - Abnormal; Notable for the following:    MCV 75.5 (*)    MCH 24.5 (*)    RDW 16.0 (*)    All other components within normal limits  COMPREHENSIVE METABOLIC PANEL - Abnormal; Notable for the following:    Potassium 3.4 (*)    Glucose, Bld 135 (*)    BUN 25 (*)    Albumin 3.3 (*)    GFR calc non Af Amer 58 (*)  GFR calc Af Amer 67 (*)    All other components within normal limits  URINALYSIS, ROUTINE W REFLEX MICROSCOPIC - Abnormal; Notable for the following:    Leukocytes, UA MODERATE (*)    All other components within normal limits  URINE MICROSCOPIC-ADD ON - Abnormal; Notable for the following:    Bacteria, UA FEW (*)    Casts HYALINE CASTS (*)    All other components within normal limits  GC/CHLAMYDIA PROBE AMP  URINE CULTURE    Imaging Review No results found.   EKG Interpretation None      MDM   Final diagnoses:  Vaginal candidiasis  Bacterial vaginosis   Patient presents to ED with vaginal discharge. This is causing her discomfort and is causing her to use many depends to keep herself clean. On my exam it does appear there is stool in her vagina. She has an appointment with GYN on Thursday. Wet prep shows bacterial vaginosis and yeast. Will treat these with flagyl and diflucan respectively. No abdominal tenderness or fever. WBC count is not elevated. No reason to suspect underlying diverticulitis. Discussed this case with Dr. Rhunette CroftNanavati who recommends patient keep her follow up appointment with GYN. No additional imaging needed at this time. Discussed strict return instructions with patient. Vital signs stable for  discharge. Patient / Family / Caregiver informed of clinical course, understand medical decision-making process, and agree with plan.     Mora BellmanHannah S Rin Gorton, PA-C 08/01/13 2129

## 2013-07-31 NOTE — ED Notes (Addendum)
Pt states that she has brown vaginal discharge that began 10 days ago, which she noticed in her depends. Pt states she saw her PCP for this and started her on an antibiotic (Cipro and Flagyl), which she reports taking as instructed. Pt reports these medications has not helped and is still having brown vaginal discharge. Pt is A/O x4, in NAD, and vitals are WDL.

## 2013-08-01 LAB — GC/CHLAMYDIA PROBE AMP
CT PROBE, AMP APTIMA: NEGATIVE
GC PROBE AMP APTIMA: NEGATIVE

## 2013-08-02 LAB — URINE CULTURE
Colony Count: NO GROWTH
Culture: NO GROWTH

## 2013-08-02 NOTE — ED Provider Notes (Signed)
Medical screening examination/treatment/procedure(s) were performed by non-physician practitioner and as supervising physician I was immediately available for consultation/collaboration.   EKG Interpretation None       Ankit Nanavati, MD 08/02/13 0220 

## 2013-08-06 ENCOUNTER — Other Ambulatory Visit: Payer: Self-pay | Admitting: Obstetrics and Gynecology

## 2013-08-06 ENCOUNTER — Other Ambulatory Visit: Payer: Self-pay | Admitting: Family Medicine

## 2013-08-06 DIAGNOSIS — L988 Other specified disorders of the skin and subcutaneous tissue: Secondary | ICD-10-CM

## 2013-08-08 ENCOUNTER — Ambulatory Visit
Admission: RE | Admit: 2013-08-08 | Discharge: 2013-08-08 | Disposition: A | Discharge status: Home / Self Care | Payer: 59 | Source: Ambulatory Visit | Attending: Obstetrics and Gynecology | Admitting: Obstetrics and Gynecology

## 2013-08-08 DIAGNOSIS — L988 Other specified disorders of the skin and subcutaneous tissue: Secondary | ICD-10-CM

## 2013-08-08 MED ORDER — IOHEXOL 300 MG/ML  SOLN
125.0000 mL | Freq: Once | INTRAMUSCULAR | Status: AC | PRN
  Administered 2013-08-08: 125 mL via INTRAVENOUS

## 2013-08-13 ENCOUNTER — Ambulatory Visit (INDEPENDENT_AMBULATORY_CARE_PROVIDER_SITE_OTHER): Payer: 59 | Admitting: Family Medicine

## 2013-08-13 ENCOUNTER — Encounter: Payer: Self-pay | Admitting: Family Medicine

## 2013-08-13 VITALS — BP 127/76 | HR 62 | Resp 16 | Ht 63.0 in | Wt 222.0 lb

## 2013-08-13 DIAGNOSIS — N9489 Other specified conditions associated with female genital organs and menstrual cycle: Secondary | ICD-10-CM

## 2013-08-13 DIAGNOSIS — L293 Anogenital pruritus, unspecified: Secondary | ICD-10-CM

## 2013-08-13 DIAGNOSIS — N898 Other specified noninflammatory disorders of vagina: Secondary | ICD-10-CM

## 2013-08-13 MED ORDER — NYSTATIN 100000 UNIT/GM EX POWD: CUTANEOUS | Status: DC

## 2013-08-13 MED ORDER — FLUCONAZOLE 200 MG PO TABS: ORAL_TABLET | ORAL | Status: DC

## 2013-08-13 MED ORDER — METRONIDAZOLE 0.75 % VA GEL: VAGINAL | Status: DC

## 2013-08-13 MED ORDER — METRONIDAZOLE 500 MG PO TABS: ORAL_TABLET | ORAL | Status: DC

## 2013-08-13 MED ORDER — TERCONAZOLE 0.4 % VA CREA: 1.0000 | TOPICAL_CREAM | Freq: Every day | VAGINAL | Status: DC

## 2013-08-13 NOTE — Patient Instructions (Signed)
1)  Vaginal Discharge/Itching - Use the Metrogel in the am and the Terazol at night.  Apply the Flander's Buttock Ointment first then put the yeast powder on the pad.  If this combination does not work then try the oral pills.

## 2013-08-13 NOTE — Progress Notes (Signed)
Subjective:    Patient ID: Katelyn [name], female    DOB: [birth year], 58 y.o.   MRN: [id number]  HPI  Katelyn [name] is back complaining of vaginal discharge/odor and itching. She was seen in the ED with this problem last week. She was told she has a fistula. She has an appointment with a surgeon July 29th. She is very uncomfortable and wanted to see if she could get more antibiotics till she has the surgery.    Review of Systems  Constitutional: Negative for activity change, appetite change and fatigue.  Genitourinary: Positive for vaginal discharge.       Vaginal itching and foul odor  All other systems reviewed and are negative.    Past Medical History  Diagnosis Date  . Sleep apnea   . Hypertension   . Heart murmur   . Seasonal allergies   . Arthritis   . Obesity   . Positive PPD, treated     INH x 9 months      Past Surgical History  Procedure Laterality Date  . Knee arthroscopy      bilateral  . Shoulder arthroscopy      right  . Colonoscopy    . Cardiac catheterization      12/14/11 good LV systolic function, mild LVH, LAD has 10-15% proximal and mid stenosis.   . Total knee revision  03/06/2012    Procedure: TOTAL KNEE REVISION;  Surgeon: Dannielle Huh, MD;  Location: [facility] OR;  Service: Orthopedics;  Laterality: Right;  . 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 Cancer Mother 29    Lung   . Hypertension  Mother   . Kidney disease Brother     On Dialysis   . Hypertension Brother   . Kidney disease Brother     S/P Kidney Transplant  . Diabetes Brother   . Hypertension Brother   . Hypertension Sister   . Cancer Sister     breast  . Hypertension Sister   . HIV/AIDS Sister      Current Outpatient Prescriptions on File Prior to Visit  Medication Sig Dispense Refill  . aspirin 81 MG chewable tablet Chew 81 mg by mouth every morning.      . Canagliflozin-Metformin HCl [phone] MG TABS Take 1 tablet by mouth 2 (two) times daily.  60 tablet  5  . cholecalciferol (VITAMIN D) 1000 UNITS tablet Take 1,000 Units by mouth daily.      Marland Kitchen loratadine (CLARITIN) 10 MG tablet Take 10 mg by mouth daily.      . Metoprolol-Hydrochlorothiazide (DUTOPROL) 100-12.5 MG TB24 Take 1 tablet by mouth daily.  90 tablet  3  . Multiple Vitamin (MULTIVITAMIN WITH MINERALS) TABS Take 1 tablet by mouth daily.  No current facility-administered medications on file prior to visit.     Allergies  Allergen Reactions  . Ibuprofen Rash  . Naproxen Rash  . Penicillins Rash    Childhood allergy      There is no immunization history for the selected administration types on file for this patient.     Objective:   Physical Exam  Vitals reviewed. Constitutional: She appears well-nourished.  Cardiovascular: Normal rate.   Pulmonary/Chest: Effort normal.  Genitourinary: Vaginal discharge found.  Musculoskeletal: Normal range of motion.  Neurological: She is alert.  Skin: Skin is warm and dry.  Psychiatric: She has a normal mood and affect. Her behavior is normal. Judgment and thought content normal.      Assessment & Plan:    Marcelino DusterMichelle was seen today for vaginal itching.  Diagnoses and associated orders for this visit:  Vaginal odor - metroNIDAZOLE (METROGEL VAGINAL) 0.75 % vaginal gel; Insert into the vagina in the am - metroNIDAZOLE (FLAGYL) 500 MG tablet; Take 1 tab daily  Vaginal  itching - terconazole (TERAZOL 7) 0.4 % vaginal cream; Place 1 applicator vaginally at bedtime. - nystatin (MYCOSTATIN/NYSTOP) 100000 UNIT/GM POWD; Apply to genital area BID - fluconazole (DIFLUCAN) 200 MG tablet; Take 1 tab po twice a week

## 2013-08-28 ENCOUNTER — Ambulatory Visit (INDEPENDENT_AMBULATORY_CARE_PROVIDER_SITE_OTHER): Payer: 59 | Admitting: General Surgery

## 2013-08-28 ENCOUNTER — Encounter (INDEPENDENT_AMBULATORY_CARE_PROVIDER_SITE_OTHER): Payer: Self-pay | Admitting: General Surgery

## 2013-08-28 VITALS — BP 132/84 | HR 64 | Temp 98.7°F | Resp 18 | Ht 64.0 in | Wt 219.2 lb

## 2013-08-28 DIAGNOSIS — N824 Other female intestinal-genital tract fistulae: Secondary | ICD-10-CM | POA: Insufficient documentation

## 2013-08-28 NOTE — Patient Instructions (Addendum)
We will schedule you an appointment with Dr Evette CristalGanem to discuss getting a repeat colonoscopy.

## 2013-08-28 NOTE — Progress Notes (Signed)
Chief Complaint  Patient presents with  . Rectal Problems    new pt- eval rectovaginal fistula    HISTORY: Katelyn [name] is a 58 y.o. female who presents to the office with a colovaginal fistula.  She has had vaginal drainage for ~3 months.  She presented to the ED in the end of June for evaluation.  The fistula was found on CT scan.  Her last colonoscopy was in 2008 by Dr Evette Cristal and normal except for diverticulosis.  Denies rectal bleeding.  No changes in bowel habits.  She has had a slight weight loss.  Patient is taking flagyl to help with vaginal inflammation.  She has no FH of colon cancer.  Past Medical History  Diagnosis Date  . Sleep apnea   . Hypertension   . Heart murmur   . Seasonal allergies   . Arthritis   . Obesity   . Positive PPD, treated     INH x 9 months     Diabetes type 2  Past Surgical History  Procedure Laterality Date  . Knee arthroscopy      bilateral  . Shoulder arthroscopy      right  . Colonoscopy    . Cardiac catheterization      12/14/11 good LV systolic function, mild LVH, LAD has 10-15% proximal and mid stenosis.   . Total knee revision  03/06/2012    Procedure: TOTAL KNEE REVISION;  Surgeon: Dannielle Huh, MD;  Location: [facility] OR;  Service: Orthopedics;  Laterality: Right;  . Abdominal hysterectomy      Uterus Removed (Fibroids)       Current Outpatient Prescriptions  Medication Sig Dispense Refill  . aspirin 81 MG chewable tablet Chew 81 mg by mouth every morning.      . cholecalciferol (VITAMIN D) 1000 UNITS tablet Take 1,000 Units by mouth daily.      . fluconazole (DIFLUCAN) 200 MG tablet Take 1 tab po twice a week  8 tablet  0  . loratadine (CLARITIN) 10 MG tablet Take 10 mg by mouth daily.      . Metoprolol-Hydrochlorothiazide (DUTOPROL) 100-12.5 MG TB24 Take 1 tablet by mouth daily.  90 tablet  3  . metroNIDAZOLE (FLAGYL) 500 MG tablet Take 1 tab daily  30 tablet  0  . metroNIDAZOLE (METROGEL VAGINAL) 0.75 % vaginal gel Insert into the  vagina in the am  70 g  2  . Multiple Vitamin (MULTIVITAMIN WITH MINERALS) TABS Take 1 tablet by mouth daily.      Marland Kitchen nystatin (MYCOSTATIN/NYSTOP) 100000 UNIT/GM POWD Apply to genital area BID  60 g  2  . terconazole (TERAZOL 7) 0.4 % vaginal cream Place 1 applicator vaginally at bedtime.  45 g  2  . Canagliflozin-Metformin HCl [phone] MG TABS Take 1 tablet by mouth 2 (two) times daily.  60 tablet  5   No current facility-administered medications for this visit.      Allergies  Allergen Reactions  . Ibuprofen Rash  . Naproxen Rash  . Penicillins Rash    Childhood allergy       Family History  Problem Relation Age of Onset  . Cancer Mother 14    Lung   . Hypertension Mother   . Kidney disease Brother     On Dialysis   . Hypertension Brother   . Kidney disease Brother     S/P Kidney Transplant  . Diabetes Brother   . Hypertension Brother   . Hypertension Sister   .  Cancer Sister     breast  . Hypertension Sister   . HIV/AIDS Sister       History   Social History  . Marital Status: Single    Spouse Name: N/A    Number of Children: 2  . Years of Education: 12 +    Occupational History  . [employer]OR    Social History Main Topics  . Smoking status: Never Smoker   . Smokeless tobacco: Never Used  . Alcohol Use: No     Comment: rare  . Drug Use: No  . Sexual Activity: Yes   Other Topics Concern  . 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[employer])     Education:  Engineer, agricultural    Tobacco Use/Exposure:  None    Alcohol Use:  Occasional   Drug Use:  None   Diet:  Regular   Exercise:  None   Hobbies:  Music, Reading and Chess                    REVIEW OF SYSTEMS - PERTINENT POSITIVES ONLY: Review of Systems -  General ROS: negative for - chills or fever Respiratory ROS: no cough, shortness of breath, or wheezing Cardiovascular ROS: no chest pain or dyspnea on exertion Gastrointestinal ROS: no abdominal pain, change in bowel habits, or black or bloody stools Genito-Urinary ROS: no dysuria, trouble voiding, or hematuria  EXAM: Filed Vitals:   08/28/13 0923  BP: 132/84  Pulse: 64  Temp: 98.7 F (37.1 C)  Resp: 18    Gen:  No acute distress.  Well nourished and well groomed.   Neurological: Alert and oriented to person, place, and time. Coordination normal.  Head: Normocephalic and atraumatic.  Eyes: Conjunctivae are normal. Pupils are equal, round, and reactive to light. No scleral icterus.  Neck: Normal range of motion. Neck supple. No tracheal deviation or thyromegaly present.  No cervical lymphadenopathy. Cardiovascular: Normal rate, regular rhythm, normal heart sounds and intact distal pulses. Respiratory: Effort normal.  No respiratory distress. No chest wall tenderness. Breath sounds normal.  GI: Soft. Bowel sounds are normal. The abdomen is soft and nontender.  There is no rebound and no guarding.  Musculoskeletal: Normal range of motion. Extremities are nontender.  Skin: Skin is warm and dry. No rash noted. No diaphoresis. No erythema. No pallor. No clubbing, cyanosis, or edema.   Psychiatric: Normal mood and affect. Behavior is normal. Judgment and thought content normal.    LABORATORY RESULTS: Available labs are reviewed   RADIOLOGY RESULTS:   Images and reports are reviewed.    ASSESSMENT AND PLAN:  Katelyn [name] is a 58 y.o. F with a newly diagnosed colovaginal fistula.  We discussed performing a robotic sigmoidectomy to repair this.  I would like her to have a colonoscopy prior to the surgery to rule out a cancer causing this fistula.  I think she should stay on the flagyl for now.    The surgery and anatomy were described to the patient as well as the risks of  surgery and the possible complications.  These include: Bleeding, deep abdominal infections and possible wound complications such as hernia and infection, damage to adjacent structures, leak of surgical connections, which can lead to  other surgeries and possibly an ostomy, possible need for other procedures, such as abscess drains in radiology, possible prolonged hospital stay, possible diarrhea from removal of part of the colon, possible constipation from narcotics, prolonged fatigue/weakness or appetite loss, possible early recurrence of of disease, possible complications of their medical problems such as heart disease or arrhythmias or lung problems, death (less than 1%). I believe the patient understands and wishes to proceed with the surgery.     Vanita PandaAlicia C Breezie Micucci, MD Colon and Rectal Surgery / General Surgery [facility]      Visit Diagnoses: 1. Colovaginal fistula     Primary Care Physician: Birdena JubileeZANARD, ROBYN, MD

## 2013-09-24 ENCOUNTER — Telehealth (INDEPENDENT_AMBULATORY_CARE_PROVIDER_SITE_OTHER): Payer: Self-pay | Admitting: *Deleted

## 2013-09-24 NOTE — Telephone Encounter (Signed)
Pt called and stated that she had her colonoscopy on 09-21-13 in the afternoon.  Pt is wanting to go ahead and move forward with the surgery.  I explained to the pt that I didn't see any results from her colonoscopy at the moment, but not sure if Dr. Evette Cristal has send them to Dr. Maisie Fus.  I advised pt to call and make sure they send Dr. Maisie Fus the results, and I would let Dr. Maisie Fus know that she has had it done and hopefully can move forward with surgical plans.   Pt verbalized understanding.  Victorino Dike

## 2013-09-25 ENCOUNTER — Telehealth (INDEPENDENT_AMBULATORY_CARE_PROVIDER_SITE_OTHER): Payer: Self-pay

## 2013-09-25 NOTE — Telephone Encounter (Signed)
Spoke with patient and informed her that I discussed her case with Dr. Luan Moore office and that they are currently waiting on results and the finalized report for the colonoscopy to be completed.  Informed her that once they have done this and sent them to me I will let her know as well as I will send order forms around to our surgery schedulers for them to contact her.  Patient verbalized understanding of this information.

## 2013-10-11 ENCOUNTER — Ambulatory Visit (INDEPENDENT_AMBULATORY_CARE_PROVIDER_SITE_OTHER): Payer: 59 | Admitting: Surgery

## 2013-10-12 ENCOUNTER — Other Ambulatory Visit (INDEPENDENT_AMBULATORY_CARE_PROVIDER_SITE_OTHER): Payer: Self-pay | Admitting: General Surgery

## 2013-12-11 ENCOUNTER — Encounter (HOSPITAL_COMMUNITY)
Admission: RE | Admit: 2013-12-11 | Discharge: 2013-12-11 | Disposition: A | Discharge status: Home / Self Care | Payer: 59 | Source: Ambulatory Visit | Attending: General Surgery | Admitting: General Surgery

## 2013-12-11 ENCOUNTER — Ambulatory Visit (HOSPITAL_COMMUNITY)
Admission: RE | Admit: 2013-12-11 | Discharge: 2013-12-11 | Disposition: A | Discharge status: Home / Self Care | Payer: 59 | Source: Ambulatory Visit | Attending: Anesthesiology | Admitting: Anesthesiology

## 2013-12-11 ENCOUNTER — Encounter (HOSPITAL_COMMUNITY): Payer: Self-pay

## 2013-12-11 DIAGNOSIS — R0989 Other specified symptoms and signs involving the circulatory and respiratory systems: Secondary | ICD-10-CM | POA: Insufficient documentation

## 2013-12-11 DIAGNOSIS — Z01818 Encounter for other preprocedural examination: Secondary | ICD-10-CM | POA: Diagnosis present

## 2013-12-11 DIAGNOSIS — I10 Essential (primary) hypertension: Secondary | ICD-10-CM

## 2013-12-11 DIAGNOSIS — I517 Cardiomegaly: Secondary | ICD-10-CM | POA: Insufficient documentation

## 2013-12-11 HISTORY — DX: Unspecified osteoarthritis, unspecified site: M19.90

## 2013-12-11 HISTORY — DX: Prediabetes: R73.03

## 2013-12-11 LAB — BASIC METABOLIC PANEL
Anion gap: 10 (ref 5–15)
BUN: 24 mg/dL — ABNORMAL HIGH (ref 6–23)
CALCIUM: 9.5 mg/dL (ref 8.4–10.5)
CO2: 29 mEq/L (ref 19–32)
Chloride: 98 mEq/L (ref 96–112)
Creatinine, Ser: 0.98 mg/dL (ref 0.50–1.10)
GFR, EST AFRICAN AMERICAN: 72 mL/min — AB (ref >90)
GFR, EST NON AFRICAN AMERICAN: 62 mL/min — AB (ref >90)
GLUCOSE: 121 mg/dL — AB (ref 70–99)
POTASSIUM: 3.7 meq/L (ref 3.7–5.3)
Sodium: 137 mEq/L (ref 137–147)

## 2013-12-11 LAB — CBC
HCT: 37.9 % (ref 36.0–46.0)
HEMOGLOBIN: 12.1 g/dL (ref 12.0–15.0)
MCH: 25.1 pg — AB (ref 26.0–34.0)
MCHC: 31.9 g/dL (ref 30.0–36.0)
MCV: 78.6 fL (ref 78.0–100.0)
Platelets: 259 10*3/uL (ref 150–400)
RBC: 4.82 MIL/uL (ref 3.87–5.11)
RDW: 15.2 % (ref 11.5–15.5)
WBC: 7.8 10*3/uL (ref 4.0–10.5)

## 2013-12-11 LAB — HEMOGLOBIN A1C
Hgb A1c MFr Bld: 7.1 % — ABNORMAL HIGH (ref <5.7)
MEAN PLASMA GLUCOSE: 157 mg/dL — AB (ref <117)

## 2013-12-11 NOTE — Patient Instructions (Signed)
19 Katelyn CarneyMichelle A Sroka  12/11/2013   Your procedure is scheduled on: Monday 12/17/13  Report to [facility] at 05:30 AM.  Call this number if you have problems the morning of surgery [phone]: [phone]   Remember: please follow any bowel prep instructions   Do not eat food or drink liquids After Midnight.     Take these medicines the morning of surgery with A SIP OF WATER: loratadine    Do not wear jewelry, make-up or nail polish.  Do not wear lotions, powders, or perfumes.   Do not shave 48 hours prior to surgery. Men may shave face and neck.  Do not bring valuables to the hospital.  Contacts, dentures or bridgework may not be worn into surgery.  Leave suitcase in the car. After surgery it may be brought to your room.  For patients admitted to the hospital, checkout time is 11:00 AM the day of discharge.    [place] - Preparing for Surgery Before surgery, you can play an important role.  Because skin is not sterile, your skin needs to be as free of germs as possible.  You can reduce the number of germs on your skin by washing with CHG (chlorahexidine gluconate) soap before surgery.  CHG is an antiseptic cleaner which kills germs and bonds with the skin to continue killing germs even after washing. Please DO NOT use if you have an allergy to CHG or antibacterial soaps.  If your skin becomes reddened/irritated stop using the CHG and inform your nurse when you arrive at Short Stay. Do not shave (including legs and underarms) for at least 48 hours prior to the first CHG shower.  You may shave your face/neck. Please follow these instructions carefully:  1.  Shower with CHG Soap the night before surgery and the  morning of Surgery.  2.  If you choose to wash your hair, wash your hair first as usual with your  normal  shampoo.  3.  After you shampoo, rinse your hair and body thoroughly to remove the  shampoo.                            4.  Use CHG as you would any other liquid  soap.  You can apply chg directly  to the skin and wash                       Gently with a scrungie or clean washcloth.  5.  Apply the CHG Soap to your body ONLY FROM THE NECK DOWN.   Do not use on face/ open                           Wound or open sores. Avoid contact with eyes, ears mouth and genitals (private parts).                       Wash face,  Genitals (private parts) with your normal soap.             6.  Wash thoroughly, paying special attention to the area where your surgery  will be performed.  7.  Thoroughly rinse your body with warm water from the neck down.  8.  DO NOT shower/wash with your normal soap after using and rinsing off  the CHG Soap.  9.  Pat yourself dry with a clean towel.            10.  Wear clean pajamas.            11.  Place clean sheets on your bed the night of your first shower and do not  sleep with pets. Day of Surgery : Do not apply any lotions/deodorants the morning of surgery.  Please wear clean clothes to the hospital/surgery center.  FAILURE TO FOLLOW THESE INSTRUCTIONS MAY RESULT IN THE CANCELLATION OF YOUR SURGERY PATIENT SIGNATURE_________________________________  NURSE SIGNATURE__________________________________  ________________________________________________________________________

## 2013-12-12 NOTE — Progress Notes (Signed)
Chest xray results routed to dr Romie Leveealicia thomas to epic inbasket

## 2013-12-13 NOTE — Anesthesia Preprocedure Evaluation (Addendum)
Anesthesia Evaluation  Patient identified by MRN, date of birth, ID band Patient awake    Reviewed: Allergy & Precautions, H&P , NPO status , Patient's Chart, lab work & pertinent test results  Airway Mallampati: II  TM Distance: >3 FB Neck ROM: Full    Dental no notable dental hx.    Pulmonary sleep apnea ,  breath sounds clear to auscultation  Pulmonary exam normal       Cardiovascular hypertension, Pt. on medications and Pt. on home beta blockers Rhythm:Regular Rate:Normal     Neuro/Psych negative neurological ROS  negative psych ROS   GI/Hepatic negative GI ROS, Neg liver ROS,   Endo/Other  Morbid obesity  Renal/GU negative Renal ROS  negative genitourinary   Musculoskeletal negative musculoskeletal ROS (+)   Abdominal   Peds negative pediatric ROS (+)  Hematology negative hematology ROS (+)   Anesthesia Other Findings   Reproductive/Obstetrics negative OB ROS                           Anesthesia Physical Anesthesia Plan  ASA: III  Anesthesia Plan: General   Post-op Pain Management:    Induction: Intravenous  Airway Management Planned: Oral ETT  Additional Equipment:   Intra-op Plan:   Post-operative Plan: Extubation in OR  Informed Consent: I have reviewed the patients History and Physical, chart, labs and discussed the procedure including the risks, benefits and alternatives for the proposed anesthesia with the patient or authorized representative who has indicated his/her understanding and acceptance.   Dental advisory given  Plan Discussed with: CRNA and Surgeon  Anesthesia Plan Comments:         Anesthesia Quick Evaluation

## 2013-12-17 ENCOUNTER — Inpatient Hospital Stay (HOSPITAL_COMMUNITY): Payer: 59 | Admitting: Anesthesiology

## 2013-12-17 ENCOUNTER — Inpatient Hospital Stay (HOSPITAL_COMMUNITY)
Admission: RE | Admit: 2013-12-17 | Discharge: 2013-12-21 | DRG: 331 | Disposition: A | Discharge status: Home / Self Care | Payer: 59 | Source: Ambulatory Visit | Attending: General Surgery | Admitting: General Surgery

## 2013-12-17 ENCOUNTER — Encounter (HOSPITAL_COMMUNITY)
Admission: RE | Disposition: A | Discharge status: Home / Self Care | Payer: Self-pay | Source: Ambulatory Visit | Attending: General Surgery

## 2013-12-17 ENCOUNTER — Encounter (HOSPITAL_COMMUNITY): Payer: Self-pay

## 2013-12-17 DIAGNOSIS — Z833 Family history of diabetes mellitus: Secondary | ICD-10-CM

## 2013-12-17 DIAGNOSIS — K5790 Diverticulosis of intestine, part unspecified, without perforation or abscess without bleeding: Secondary | ICD-10-CM | POA: Diagnosis present

## 2013-12-17 DIAGNOSIS — M1990 Unspecified osteoarthritis, unspecified site: Secondary | ICD-10-CM | POA: Diagnosis present

## 2013-12-17 DIAGNOSIS — G4730 Sleep apnea, unspecified: Secondary | ICD-10-CM | POA: Diagnosis present

## 2013-12-17 DIAGNOSIS — Z830 Family history of human immunodeficiency virus [HIV] disease: Secondary | ICD-10-CM | POA: Diagnosis not present

## 2013-12-17 DIAGNOSIS — Z6839 Body mass index (BMI) 39.0-39.9, adult: Secondary | ICD-10-CM | POA: Diagnosis not present

## 2013-12-17 DIAGNOSIS — Z880 Allergy status to penicillin: Secondary | ICD-10-CM | POA: Diagnosis not present

## 2013-12-17 DIAGNOSIS — Z888 Allergy status to other drugs, medicaments and biological substances status: Secondary | ICD-10-CM

## 2013-12-17 DIAGNOSIS — E119 Type 2 diabetes mellitus without complications: Secondary | ICD-10-CM | POA: Diagnosis present

## 2013-12-17 DIAGNOSIS — Z803 Family history of malignant neoplasm of breast: Secondary | ICD-10-CM

## 2013-12-17 DIAGNOSIS — N823 Fistula of vagina to large intestine: Secondary | ICD-10-CM | POA: Diagnosis present

## 2013-12-17 DIAGNOSIS — Z23 Encounter for immunization: Secondary | ICD-10-CM | POA: Diagnosis not present

## 2013-12-17 DIAGNOSIS — Z801 Family history of malignant neoplasm of trachea, bronchus and lung: Secondary | ICD-10-CM | POA: Diagnosis not present

## 2013-12-17 DIAGNOSIS — Z8249 Family history of ischemic heart disease and other diseases of the circulatory system: Secondary | ICD-10-CM

## 2013-12-17 DIAGNOSIS — Z79899 Other long term (current) drug therapy: Secondary | ICD-10-CM

## 2013-12-17 DIAGNOSIS — N824 Other female intestinal-genital tract fistulae: Secondary | ICD-10-CM | POA: Diagnosis present

## 2013-12-17 DIAGNOSIS — I10 Essential (primary) hypertension: Secondary | ICD-10-CM | POA: Diagnosis present

## 2013-12-17 DIAGNOSIS — Z7982 Long term (current) use of aspirin: Secondary | ICD-10-CM

## 2013-12-17 LAB — GLUCOSE, CAPILLARY
GLUCOSE-CAPILLARY: 195 mg/dL — AB (ref 70–99)
Glucose-Capillary: 171 mg/dL — ABNORMAL HIGH (ref 70–99)
Glucose-Capillary: 145 mg/dL — ABNORMAL HIGH (ref 70–99)

## 2013-12-17 LAB — TYPE AND SCREEN
ABO/RH(D): A POS
Antibody Screen: NEGATIVE

## 2013-12-17 SURGERY — COLECTOMY, PARTIAL, ROBOT-ASSISTED, LAPAROSCOPIC
Anesthesia: General

## 2013-12-17 MED ORDER — DEXTROSE 5 % IV SOLN
2.0000 g | Freq: Two times a day (BID) | INTRAVENOUS | Status: AC
  Administered 2013-12-17: 2 g via INTRAVENOUS
  Filled 2013-12-17: qty 2

## 2013-12-17 MED ORDER — FENTANYL CITRATE 0.05 MG/ML IJ SOLN
INTRAMUSCULAR | Status: AC
  Filled 2013-12-17: qty 5

## 2013-12-17 MED ORDER — HYDROMORPHONE HCL 1 MG/ML IJ SOLN
INTRAMUSCULAR | Status: DC | PRN
  Administered 2013-12-17: .5 mg via INTRAVENOUS
  Administered 2013-12-17: 1 mg via INTRAVENOUS

## 2013-12-17 MED ORDER — NALOXONE HCL 0.4 MG/ML IJ SOLN
INTRAMUSCULAR | Status: AC
  Filled 2013-12-17: qty 1

## 2013-12-17 MED ORDER — PROPOFOL 10 MG/ML IV BOLUS
INTRAVENOUS | Status: AC
  Filled 2013-12-17: qty 20

## 2013-12-17 MED ORDER — BUPIVACAINE-EPINEPHRINE 0.25% -1:200000 IJ SOLN
INTRAMUSCULAR | Status: DC | PRN
  Administered 2013-12-17: 45 mL

## 2013-12-17 MED ORDER — INFLUENZA VAC SPLIT QUAD 0.5 ML IM SUSY
0.5000 mL | PREFILLED_SYRINGE | INTRAMUSCULAR | Status: AC
  Administered 2013-12-18: 0.5 mL via INTRAMUSCULAR
  Filled 2013-12-17 (×2): qty 0.5

## 2013-12-17 MED ORDER — DEXTROSE 5 % IV SOLN
2.0000 g | INTRAVENOUS | Status: AC
  Administered 2013-12-17: 2 g via INTRAVENOUS
  Filled 2013-12-17: qty 2

## 2013-12-17 MED ORDER — LACTATED RINGERS IV SOLN
INTRAVENOUS | Status: DC | PRN
  Administered 2013-12-17 (×3): via INTRAVENOUS

## 2013-12-17 MED ORDER — PNEUMOCOCCAL VAC POLYVALENT 25 MCG/0.5ML IJ INJ
0.5000 mL | INJECTION | INTRAMUSCULAR | Status: AC
  Administered 2013-12-18: 0.5 mL via INTRAMUSCULAR
  Filled 2013-12-17 (×2): qty 0.5

## 2013-12-17 MED ORDER — DEXAMETHASONE SODIUM PHOSPHATE 10 MG/ML IJ SOLN
INTRAMUSCULAR | Status: DC | PRN
  Administered 2013-12-17: 10 mg via INTRAVENOUS

## 2013-12-17 MED ORDER — NALOXONE HCL 0.4 MG/ML IJ SOLN: 0.4000 mg | INTRAMUSCULAR | Status: DC | PRN

## 2013-12-17 MED ORDER — MENTHOL 3 MG MT LOZG
1.0000 | LOZENGE | OROMUCOSAL | Status: DC | PRN
  Administered 2013-12-17: 3 mg via ORAL
  Filled 2013-12-17: qty 9

## 2013-12-17 MED ORDER — LACTATED RINGERS IV SOLN
INTRAVENOUS | Status: DC
  Administered 2013-12-17 – 2013-12-18 (×2): via INTRAVENOUS

## 2013-12-17 MED ORDER — LACTATED RINGERS IR SOLN
Status: DC | PRN
  Administered 2013-12-17: 1000 mL

## 2013-12-17 MED ORDER — FENTANYL CITRATE 0.05 MG/ML IJ SOLN
INTRAMUSCULAR | Status: DC | PRN
  Administered 2013-12-17 (×2): 100 ug via INTRAVENOUS
  Administered 2013-12-17: 50 ug via INTRAVENOUS

## 2013-12-17 MED ORDER — LABETALOL HCL 5 MG/ML IV SOLN
INTRAVENOUS | Status: AC
  Filled 2013-12-17: qty 4

## 2013-12-17 MED ORDER — METOPROLOL SUCCINATE ER 100 MG PO TB24
100.0000 mg | ORAL_TABLET | Freq: Once | ORAL | Status: AC
  Administered 2013-12-17: 100 mg via ORAL
  Filled 2013-12-17: qty 1

## 2013-12-17 MED ORDER — DEXAMETHASONE SODIUM PHOSPHATE 10 MG/ML IJ SOLN
INTRAMUSCULAR | Status: AC
  Filled 2013-12-17: qty 1

## 2013-12-17 MED ORDER — ACETAMINOPHEN 500 MG PO TABS
1000.0000 mg | ORAL_TABLET | Freq: Four times a day (QID) | ORAL | Status: AC
  Administered 2013-12-17 – 2013-12-18 (×4): 1000 mg via ORAL
  Filled 2013-12-17 (×4): qty 2

## 2013-12-17 MED ORDER — PHENOL 1.4 % MT LIQD
1.0000 | OROMUCOSAL | Status: DC | PRN
  Administered 2013-12-17: 1 via OROMUCOSAL
  Filled 2013-12-17: qty 177

## 2013-12-17 MED ORDER — ALVIMOPAN 12 MG PO CAPS
12.0000 mg | ORAL_CAPSULE | Freq: Two times a day (BID) | ORAL | Status: DC
  Administered 2013-12-18 – 2013-12-20 (×4): 12 mg via ORAL
  Filled 2013-12-17 (×5): qty 1

## 2013-12-17 MED ORDER — DIPHENHYDRAMINE HCL 50 MG/ML IJ SOLN: 12.5000 mg | Freq: Four times a day (QID) | INTRAMUSCULAR | Status: DC | PRN

## 2013-12-17 MED ORDER — METOPROLOL-HCTZ ER 100-12.5 MG PO TB24: 1.0000 | ORAL_TABLET | Freq: Every day | ORAL | Status: DC

## 2013-12-17 MED ORDER — PROPOFOL 10 MG/ML IV BOLUS
INTRAVENOUS | Status: DC | PRN
  Administered 2013-12-17: 200 mg via INTRAVENOUS

## 2013-12-17 MED ORDER — HEPARIN SODIUM (PORCINE) 5000 UNIT/ML IJ SOLN
5000.0000 [IU] | Freq: Three times a day (TID) | INTRAMUSCULAR | Status: AC
  Administered 2013-12-17: 5000 [IU] via SUBCUTANEOUS

## 2013-12-17 MED ORDER — HYDROCHLOROTHIAZIDE 12.5 MG PO CAPS
12.5000 mg | ORAL_CAPSULE | Freq: Every day | ORAL | Status: DC
  Administered 2013-12-17 – 2013-12-21 (×5): 12.5 mg via ORAL
  Filled 2013-12-17 (×5): qty 1

## 2013-12-17 MED ORDER — GLYCOPYRROLATE 0.2 MG/ML IJ SOLN
INTRAMUSCULAR | Status: DC | PRN
  Administered 2013-12-17: 0.6 mg via INTRAVENOUS

## 2013-12-17 MED ORDER — HEPARIN SODIUM (PORCINE) 5000 UNIT/ML IJ SOLN
INTRAMUSCULAR | Status: AC
  Filled 2013-12-17: qty 1

## 2013-12-17 MED ORDER — HYDROMORPHONE HCL 2 MG/ML IJ SOLN
INTRAMUSCULAR | Status: AC
  Filled 2013-12-17: qty 1

## 2013-12-17 MED ORDER — LIDOCAINE HCL (CARDIAC) 20 MG/ML IV SOLN
INTRAVENOUS | Status: DC | PRN
  Administered 2013-12-17: 100 mg via INTRAVENOUS

## 2013-12-17 MED ORDER — ACETAMINOPHEN 10 MG/ML IV SOLN
1000.0000 mg | Freq: Once | INTRAVENOUS | Status: AC
  Administered 2013-12-17: 1000 mg via INTRAVENOUS
  Filled 2013-12-17: qty 100

## 2013-12-17 MED ORDER — ROCURONIUM BROMIDE 100 MG/10ML IV SOLN
INTRAVENOUS | Status: DC | PRN
  Administered 2013-12-17 (×5): 10 mg via INTRAVENOUS
  Administered 2013-12-17: 40 mg via INTRAVENOUS

## 2013-12-17 MED ORDER — DIPHENHYDRAMINE HCL 12.5 MG/5ML PO ELIX: 12.5000 mg | ORAL_SOLUTION | Freq: Four times a day (QID) | ORAL | Status: DC | PRN

## 2013-12-17 MED ORDER — METOPROLOL SUCCINATE ER 100 MG PO TB24
100.0000 mg | ORAL_TABLET | Freq: Every day | ORAL | Status: DC
  Administered 2013-12-17 – 2013-12-21 (×5): 100 mg via ORAL
  Filled 2013-12-17: qty 1
  Filled 2013-12-17: qty 4
  Filled 2013-12-17 (×2): qty 1
  Filled 2013-12-17: qty 4

## 2013-12-17 MED ORDER — LIDOCAINE HCL (CARDIAC) 20 MG/ML IV SOLN
INTRAVENOUS | Status: AC
  Filled 2013-12-17: qty 5

## 2013-12-17 MED ORDER — INSULIN ASPART 100 UNIT/ML ~~LOC~~ SOLN
0.0000 [IU] | Freq: Every day | SUBCUTANEOUS | Status: DC
  Administered 2013-12-19: 2 [IU] via SUBCUTANEOUS

## 2013-12-17 MED ORDER — BUPIVACAINE-EPINEPHRINE 0.25% -1:200000 IJ SOLN
INTRAMUSCULAR | Status: AC
  Filled 2013-12-17: qty 1

## 2013-12-17 MED ORDER — ONDANSETRON HCL 4 MG/2ML IJ SOLN
INTRAMUSCULAR | Status: AC
  Filled 2013-12-17: qty 2

## 2013-12-17 MED ORDER — MORPHINE SULFATE (PF) 1 MG/ML IV SOLN
INTRAVENOUS | Status: DC
  Administered 2013-12-17: 18:00:00 via INTRAVENOUS
  Administered 2013-12-17: 4.5 mg via INTRAVENOUS
  Administered 2013-12-18: 3 mg via INTRAVENOUS
  Administered 2013-12-18: 0 mg via INTRAVENOUS
  Administered 2013-12-18: 15:00:00 via INTRAVENOUS
  Administered 2013-12-18: 8.5 mg via INTRAVENOUS
  Administered 2013-12-18: 1.5 mg via INTRAVENOUS
  Administered 2013-12-19: 4.5 mg via INTRAVENOUS
  Administered 2013-12-19: 6 mg via INTRAVENOUS
  Administered 2013-12-19: 4.5 mg via INTRAVENOUS
  Administered 2013-12-19: 4 mg via INTRAVENOUS
  Administered 2013-12-20: via INTRAVENOUS
  Filled 2013-12-17 (×3): qty 25

## 2013-12-17 MED ORDER — HYDROMORPHONE HCL 1 MG/ML IJ SOLN: 0.2500 mg | INTRAMUSCULAR | Status: DC | PRN

## 2013-12-17 MED ORDER — INSULIN ASPART 100 UNIT/ML ~~LOC~~ SOLN
0.0000 [IU] | Freq: Three times a day (TID) | SUBCUTANEOUS | Status: DC
  Administered 2013-12-17: 3 [IU] via SUBCUTANEOUS
  Administered 2013-12-18 – 2013-12-19 (×4): 2 [IU] via SUBCUTANEOUS
  Administered 2013-12-20: 3 [IU] via SUBCUTANEOUS
  Administered 2013-12-20 (×2): 2 [IU] via SUBCUTANEOUS

## 2013-12-17 MED ORDER — ONDANSETRON HCL 4 MG/2ML IJ SOLN
INTRAMUSCULAR | Status: DC | PRN
  Administered 2013-12-17: 4 mg via INTRAVENOUS

## 2013-12-17 MED ORDER — ROCURONIUM BROMIDE 100 MG/10ML IV SOLN
INTRAVENOUS | Status: AC
  Filled 2013-12-17: qty 1

## 2013-12-17 MED ORDER — CEFOTETAN DISODIUM-DEXTROSE 2-2.08 GM-% IV SOLR
INTRAVENOUS | Status: AC
  Filled 2013-12-17: qty 50

## 2013-12-17 MED ORDER — PROMETHAZINE HCL 25 MG/ML IJ SOLN: 6.2500 mg | INTRAMUSCULAR | Status: DC | PRN

## 2013-12-17 MED ORDER — LACTATED RINGERS IV SOLN
INTRAVENOUS | Status: DC
Start: 2013-12-17 — End: 2013-12-17
  Administered 2013-12-17: 13:00:00 via INTRAVENOUS

## 2013-12-17 MED ORDER — ONDANSETRON HCL 4 MG/2ML IJ SOLN: 4.0000 mg | Freq: Four times a day (QID) | INTRAMUSCULAR | Status: DC | PRN

## 2013-12-17 MED ORDER — 0.9 % SODIUM CHLORIDE (POUR BTL) OPTIME
TOPICAL | Status: DC | PRN
  Administered 2013-12-17: 2000 mL

## 2013-12-17 MED ORDER — SODIUM CHLORIDE 0.9 % IJ SOLN: 9.0000 mL | INTRAMUSCULAR | Status: DC | PRN

## 2013-12-17 MED ORDER — NEOSTIGMINE METHYLSULFATE 10 MG/10ML IV SOLN
INTRAVENOUS | Status: DC | PRN
  Administered 2013-12-17: 5 mg via INTRAVENOUS

## 2013-12-17 MED ORDER — ALVIMOPAN 12 MG PO CAPS
12.0000 mg | ORAL_CAPSULE | Freq: Once | ORAL | Status: AC
  Administered 2013-12-17: 12 mg via ORAL
  Filled 2013-12-17: qty 1

## 2013-12-17 MED ORDER — NALOXONE HCL 0.4 MG/ML IJ SOLN
0.4000 mg | INTRAMUSCULAR | Status: DC | PRN
  Administered 2013-12-17: 0.125 mg via INTRAVENOUS

## 2013-12-17 MED ORDER — MIDAZOLAM HCL 5 MG/5ML IJ SOLN
INTRAMUSCULAR | Status: DC | PRN
  Administered 2013-12-17: 2 mg via INTRAVENOUS

## 2013-12-17 MED ORDER — ENOXAPARIN SODIUM 40 MG/0.4ML ~~LOC~~ SOLN
40.0000 mg | SUBCUTANEOUS | Status: DC
  Administered 2013-12-18 – 2013-12-21 (×4): 40 mg via SUBCUTANEOUS
  Filled 2013-12-17 (×5): qty 0.4

## 2013-12-17 MED ORDER — HEPARIN SODIUM (PORCINE) 5000 UNIT/ML IJ SOLN: 5000.0000 [IU] | Freq: Three times a day (TID) | INTRAMUSCULAR | Status: DC

## 2013-12-17 MED ORDER — SUCCINYLCHOLINE CHLORIDE 20 MG/ML IJ SOLN
INTRAMUSCULAR | Status: DC | PRN
  Administered 2013-12-17: 100 mg via INTRAVENOUS

## 2013-12-17 MED ORDER — MIDAZOLAM HCL 2 MG/2ML IJ SOLN
INTRAMUSCULAR | Status: AC
  Filled 2013-12-17: qty 2

## 2013-12-17 MED ORDER — LABETALOL HCL 5 MG/ML IV SOLN
INTRAVENOUS | Status: DC | PRN
  Administered 2013-12-17 (×3): 5 mg via INTRAVENOUS

## 2013-12-17 SURGICAL SUPPLY — 94 items
BLADE EXTENDED COATED 6.5IN (ELECTRODE) ×3 IMPLANT
BLADE SURG SZ11 CARB STEEL (BLADE) IMPLANT
CANNULA REDUC XI 12-8 STAPL (CANNULA) ×1
CANNULA REDUC XI 12-8MM STAPL (CANNULA) ×1
CANNULA REDUCER 12-8 DVNC XI (CANNULA) ×1 IMPLANT
CELLS DAT CNTRL 66122 CELL SVR (MISCELLANEOUS) IMPLANT
CHLORAPREP W/TINT 26ML (MISCELLANEOUS) ×6 IMPLANT
CLIP LIGATING HEM O LOK PURPLE (MISCELLANEOUS) ×3 IMPLANT
CLIP LIGATING HEMOLOK MED (MISCELLANEOUS) IMPLANT
COVER MAYO STAND STRL (DRAPES) ×3 IMPLANT
COVER TIP SHEARS 8 DVNC (MISCELLANEOUS) ×1 IMPLANT
COVER TIP SHEARS 8MM DA VINCI (MISCELLANEOUS) ×2
DECANTER SPIKE VIAL GLASS SM (MISCELLANEOUS) ×3 IMPLANT
DERMABOND ADVANCED (GAUZE/BANDAGES/DRESSINGS) ×2
DERMABOND ADVANCED .7 DNX12 (GAUZE/BANDAGES/DRESSINGS) ×1 IMPLANT
DEVICE TROCAR PUNCTURE CLOSURE (ENDOMECHANICALS) IMPLANT
DRAPE ARM DVNC X/XI (DISPOSABLE) ×4 IMPLANT
DRAPE COLUMN DVNC XI (DISPOSABLE) ×1 IMPLANT
DRAPE DA VINCI XI ARM (DISPOSABLE) ×8
DRAPE DA VINCI XI COLUMN (DISPOSABLE) ×2
DRAPE SHEET LG 3/4 BI-LAMINATE (DRAPES) ×3 IMPLANT
DRAPE SURG IRRIG POUCH 19X23 (DRAPES) ×3 IMPLANT
DRAPE WARM FLUID 44X44 (DRAPE) ×3 IMPLANT
DRSG OPSITE POSTOP 3X4 (GAUZE/BANDAGES/DRESSINGS) ×3 IMPLANT
DRSG OPSITE POSTOP 4X10 (GAUZE/BANDAGES/DRESSINGS) IMPLANT
DRSG OPSITE POSTOP 4X6 (GAUZE/BANDAGES/DRESSINGS) IMPLANT
DRSG OPSITE POSTOP 4X8 (GAUZE/BANDAGES/DRESSINGS) IMPLANT
ELECT PENCIL ROCKER SW 15FT (MISCELLANEOUS) ×6 IMPLANT
ELECT REM PT RETURN 15FT ADLT (MISCELLANEOUS) ×3 IMPLANT
ENDOLOOP SUT PDS II  0 18 (SUTURE)
ENDOLOOP SUT PDS II 0 18 (SUTURE) IMPLANT
EVACUATOR SILICONE 100CC (DRAIN) IMPLANT
GAUZE SPONGE 4X4 12PLY STRL (GAUZE/BANDAGES/DRESSINGS) IMPLANT
GLOVE BIO SURGEON STRL SZ 6.5 (GLOVE) ×6 IMPLANT
GLOVE BIO SURGEONS STRL SZ 6.5 (GLOVE) ×3
GLOVE BIOGEL PI IND STRL 7.0 (GLOVE) ×3 IMPLANT
GLOVE BIOGEL PI INDICATOR 7.0 (GLOVE) ×6
GOWN STRL REUS W/TWL 2XL LVL3 (GOWN DISPOSABLE) ×9 IMPLANT
GOWN STRL REUS W/TWL XL LVL3 (GOWN DISPOSABLE) ×18 IMPLANT
HOLDER FOLEY CATH W/STRAP (MISCELLANEOUS) ×3 IMPLANT
KIT BASIN OR (CUSTOM PROCEDURE TRAY) ×6 IMPLANT
LEGGING LITHOTOMY PAIR STRL (DRAPES) ×3 IMPLANT
NEEDLE HYPO 22GX1.5 SAFETY (NEEDLE) ×3 IMPLANT
NEEDLE INSUFFLATION 14GA 120MM (NEEDLE) ×3 IMPLANT
PACK CARDIOVASCULAR III (CUSTOM PROCEDURE TRAY) ×3 IMPLANT
PACK GENERAL/GYN (CUSTOM PROCEDURE TRAY) ×3 IMPLANT
PORT LAP GEL ALEXIS MED 5-9CM (MISCELLANEOUS) ×3 IMPLANT
RTRCTR WOUND ALEXIS 18CM MED (MISCELLANEOUS)
SCISSORS LAP 5X45 EPIX DISP (ENDOMECHANICALS) ×3 IMPLANT
SEAL CANN UNIV 5-8 DVNC XI (MISCELLANEOUS) ×4 IMPLANT
SEAL XI 5MM-8MM UNIVERSAL (MISCELLANEOUS) ×8
SEALER VESSEL DA VINCI XI (MISCELLANEOUS) ×2
SEALER VESSEL EXT DVNC XI (MISCELLANEOUS) ×1 IMPLANT
SET IRRIG TUBING LAPAROSCOPIC (IRRIGATION / IRRIGATOR) ×3 IMPLANT
SLEEVE XCEL OPT CAN 5 100 (ENDOMECHANICALS) IMPLANT
SOLUTION ELECTROLUBE (MISCELLANEOUS) ×3 IMPLANT
SPONGE LAP 18X18 X RAY DECT (DISPOSABLE) IMPLANT
STAPLER 45 BLU RELOAD XI (STAPLE) ×2 IMPLANT
STAPLER 45 BLUE RELOAD XI (STAPLE) ×4
STAPLER 45 GREEN RELOAD XI (STAPLE)
STAPLER 45 GRN RELOAD XI (STAPLE) IMPLANT
STAPLER CANNULA SEAL DVNC XI (STAPLE) IMPLANT
STAPLER CANNULA SEAL XI (STAPLE)
STAPLER CIRC CVD 29MM 37CM (STAPLE) ×3 IMPLANT
STAPLER SHEATH (SHEATH) ×2
STAPLER SHEATH ENDOWRIST DVNC (SHEATH) ×1 IMPLANT
STAPLER VISISTAT 35W (STAPLE) ×3 IMPLANT
SUCTION POOLE TIP (SUCTIONS) ×3 IMPLANT
SUT NOVA NAB DX-16 0-1 5-0 T12 (SUTURE) ×6 IMPLANT
SUT PDS AB 1 CTX 36 (SUTURE) IMPLANT
SUT PDS AB 1 TP1 96 (SUTURE) IMPLANT
SUT PROLENE 2 0 KS (SUTURE) ×6 IMPLANT
SUT SILK 2 0 (SUTURE) ×2
SUT SILK 2 0 SH CR/8 (SUTURE) ×3 IMPLANT
SUT SILK 2-0 18XBRD TIE 12 (SUTURE) ×1 IMPLANT
SUT SILK 3 0 (SUTURE) ×2
SUT SILK 3 0 SH CR/8 (SUTURE) ×3 IMPLANT
SUT SILK 3-0 18XBRD TIE 12 (SUTURE) ×1 IMPLANT
SUT VIC AB 2-0 SH 18 (SUTURE) ×3 IMPLANT
SUT VIC AB 2-0 SH 27 (SUTURE) ×2
SUT VIC AB 2-0 SH 27X BRD (SUTURE) ×1 IMPLANT
SUT VIC AB 4-0 PS2 27 (SUTURE) ×6 IMPLANT
SYR 20CC LL (SYRINGE) ×3 IMPLANT
SYRINGE 10CC LL (SYRINGE) ×3 IMPLANT
SYS LAPSCP GELPORT 120MM (MISCELLANEOUS)
SYSTEM LAPSCP GELPORT 120MM (MISCELLANEOUS) IMPLANT
TOWEL OR 17X26 10 PK STRL BLUE (TOWEL DISPOSABLE) ×6 IMPLANT
TOWEL OR NON WOVEN STRL DISP B (DISPOSABLE) ×3 IMPLANT
TRAY FOLEY CATH 14FRSI W/METER (CATHETERS) ×3 IMPLANT
TROCAR BLADELESS OPT 5 100 (ENDOMECHANICALS) ×3 IMPLANT
TUBING CONNECTING 10 (TUBING) IMPLANT
TUBING CONNECTING 10' (TUBING)
TUBING FILTER THERMOFLATOR (ELECTROSURGICAL) ×3 IMPLANT
YANKAUER SUCT BULB TIP 10FT TU (MISCELLANEOUS) ×3 IMPLANT

## 2013-12-17 NOTE — Progress Notes (Signed)
RT placed pt on auto titrate CPAP 6-20cmH2O with 2L of oxygen bled in via ffm. Sterile water was added for humidification. Pt states she is comfortable and is tolerating CPAP well at this time. RT will continue to monitor.

## 2013-12-17 NOTE — Plan of Care (Signed)
Problem: Phase I Progression Outcomes Goal: Incision/dressings dry and intact Outcome: Completed/Met Date Met:  12/17/13     

## 2013-12-17 NOTE — Progress Notes (Signed)
Decision to admit patient to stepdown unit. Continuing to have apneic pauses off bipap. Will be continuously monitored until tomorrow am

## 2013-12-17 NOTE — Progress Notes (Signed)
eLink Physician-Brief Progress Note Patient Name: Katelyn CarneyMichelle A [name] DOB: 04/10/1955 MRN: [id number]   Date of Service  12/17/2013  HPI/Events of Note    eICU Interventions  NPPV orders clarified     Intervention Category Major Interventions: Respiratory failure - evaluation and management  Billy FischerDavid Simonds 12/17/2013, 4:39 PM

## 2013-12-17 NOTE — Op Note (Signed)
12/17/2013  11:53 AM  PATIENT:  Katelyn [name]  58 y.o. female  Patient Care Team: Gillian Scarceobyn K Zanard, MD as PCP - General (Family Medicine)  PRE-OPERATIVE DIAGNOSIS:  colovaginal fistula  POST-OPERATIVE DIAGNOSIS:  colovaginal fistula  PROCEDURE: XI ROBOT ASSISTED SIGMOID COLECTOMY, SPLENIC FLEXURE MOBILIZATION, CLOSURE OF VAGINAL FISTULA SITE   Surgeon(s): Romie LeveeAlicia Twana Wileman, MD Karie SodaSteven Gross, MD  ASSISTANT: Dr Michaell CowingGross   ANESTHESIA:   local and general  EBL: 200ml Total I/O In: 2000 [I.V.:2000] Out: 250 [Urine:100; Blood:150]  Delay start of Pharmacological VTE agent (>24hrs) due to surgical blood loss or risk of bleeding:  no  DRAINS: none   SPECIMEN:  Source of Specimen:  Sigmoid colon  DISPOSITION OF SPECIMEN:  PATHOLOGY  COUNTS:  YES  PLAN OF CARE: Admit to inpatient   PATIENT DISPOSITION:  PACU - hemodynamically stable.  INDICATION:    This is a 58 y.o. F with a colovaginal fistula  I recommended segmental resection:  The anatomy & physiology of the digestive tract was discussed.  The pathophysiology was discussed.  Natural history risks without surgery was discussed.   I worked to give an overview of the disease and the frequent need to have multispecialty involvement.  I feel the risks of no intervention will lead to serious problems that outweigh the operative risks; therefore, I recommended a partial colectomy to remove the pathology.  Laparoscopic & open techniques were discussed.   Risks such as bleeding, infection, abscess, leak, reoperation, possible ostomy, hernia, heart attack, death, and other risks were discussed.  I noted a good likelihood this will help address the problem.   Goals of post-operative recovery were discussed as well.    The patient expressed understanding & wished to proceed with surgery.  OR FINDINGS:   Patient had obvious diverticular disease extending along the L sidewall into the pelvis involving the distal sigmoid colon.   Large  fistula ~1cm defect.  The anastomosis rests 12 cm from the anal verge by rigid proctoscopy.  DESCRIPTION:   Informed consent was confirmed.  The patient underwent general anaesthesia without difficulty.  The patient was positioned appropriately.  VTE prevention in place.  The patient's abdomen was clipped, prepped, & draped in a sterile fashion.  Surgical timeout confirmed our plan.  The patient was positioned in reverse Trendelenburg.  Abdominal entry was gained using Veress needle.  Entry was clean.  I induced carbon dioxide insufflation.  Camera inspection revealed no injury.  Extra ports were carefully placed under direct laparoscopic visualization.  A 12mm port was placed in the L mid abdomen.  An 8mm port was placed in the RLQ and LUQ under direct visualization. A 5mm assist port was placed.    I reflected the greater omentum and the upper abdomen the small bowel in the upper abdomen. I scored the base of peritoneum of the right side of the mesentery of the left colon from the ligament of Treitz to the peritoneal reflection of the mid rectum.  The patient had obvious inflammation and diseased colon adherent to the L pelvic sidewall and to her vagina distally.  I elevated the sigmoid mesentery and enetered into the retro-mesenteric plane. We were eventually able to identify the left ureter and gonadal vessels. We kept those posterior within the retroperitoneum and elevated the left colon mesentery off that.  I continued distally and got into the avascular plane posterior to the mesorectum. This allowed me to help mobilize the rectum as well by freeing the mesorectum off the  sacrum.  I mobilized the peritoneal coverings towards the peritoneal reflection on both the right and left sides of the rectum.  I mobilized the colon from a proximal known and clear plane down into the pelvis. I could see the left ureter and stayed away above and medial to this.  I used sharp and blunt dissection to mobilize the  sigmoid from the vaginal cuff.  This left an ~1cm defect in the vaginal wall.  This was closed with a 2-0 Vicryl suture.     I skeletonized the inferior mesenteric artery pedicle. I isolated the inferior mesenteric vein off of the ligament of Treitz just cephalad to that as well.  After confirming the left ureter was out of the way, I went ahead and ligated the inferior mesenteric artery pedicle with bipolar vessel sealer ~2cm above its takeoff from the aorta.   I did ligate the inferior mesenteric vein in a similar fashion.  We ensured hemostasis. I skeletonized the mesorectum at the junction at the proximal rectum using blunt dissection & bipolar vessel sealer.  I mobilized the left colon in a lateral to medial fashion off the line of Toldt up towards the splenic flexure to ensure good mobilization of the left colon to reach into the pelvis.  I mobilized the splenic flexure using blunt dissection and the vessel sealer the separate the omentum from the transverse colon and to divide the attachments to the spleen.  After this the colon was mobile enough to reach the pelvis.  The rectosigmoid junction was identified.  The vessel sealer was used to divide the mesentery.  2 blue load, robotic stapler fires were used to divide the rectosigmoid junction.  The remaining mesentery was divided back to normal healthy, well perfused descending colon.  The 12mm port site was enlarged and an Alexis wound protector was placed.  The specimen was extracted.  A purse stringer was placed over the proximal colon.  A 2-0 prolene was used to create the pursestring. The specimen was transected and sent to pathology. The purse string was secured with a 3-0 silk sutures. A 29 mm EEA anvil was placed and tied into place.  This was placed into the abdomen.  The anastomosis was created without tension using laparoscopic guidance.  The anastomosis did not leak when insufflated with air under water.  The pelvis was irrigated.  Hemostasis was  good.  All instruments and ports were removed.  The drapes, instruments and gowns were removed and changed.   I closed the peritonuem using a running 2-0 Vicryl stitch.  The Fascia was closed with interrupted 0 Novafil sutures.  The subcutaneous layer was closed with interrupted 2-0 Vicryl sutures after irrigation.  The skin of all port sites were closed with 4-0 Vicryl sutures.  A sterile dressing was placed over the extraction site.  All counts were correct per OR staff.  The patient was awakened from anesthesia and sent to the PACU in stable condition.

## 2013-12-17 NOTE — Transfer of Care (Signed)
Immediate Anesthesia Transfer of Care Note  Patient: Katelyn CarneyMichelle A Graves  Procedure(s) Performed: Procedure(s): XI ROBOT ASSISTED LAPAROSCOPIC PARTIAL COLECTOMY (N/A)  Patient Location: PACU  Anesthesia Type:General  Level of Consciousness: sedated  Airway & Oxygen Therapy: Patient Spontanous Breathing and Patient connected to face mask oxygen  Post-op Assessment: Report given to PACU RN and Post -op Vital signs reviewed and stable  Post vital signs: Reviewed and stable  Complications: No apparent anesthesia complications

## 2013-12-17 NOTE — Progress Notes (Signed)
Took pt off BIPAP per MD.  Pt placed on Simple Mask.  BIPAP on standby. RT to monitor

## 2013-12-17 NOTE — Progress Notes (Signed)
Placed pt back on BIPAP due to insufficient Resp effort.

## 2013-12-17 NOTE — H&P (Signed)
Chief Complaint  Patient presents with  . Rectal Problems    new pt- eval rectovaginal fistula    HISTORY: Katelyn [name] is a 58 y.o. female who presents to the office with a colovaginal fistula. She has had vaginal drainage for ~6 months. She presented to the ED in the end of June for evaluation. The fistula was found on CT scan. Her last colonoscopy was in 2008 by Dr Katelyn [name] and normal except for diverticulosis. Denies rectal bleeding. No changes in bowel habits. She has had a slight weight loss. Patient is taking flagyl to help with vaginal inflammation. She has no FH of colon cancer.  Past Medical History  Diagnosis Date  . Sleep apnea   . Hypertension   . Heart murmur   . Seasonal allergies   . Arthritis   . Obesity   . Positive PPD, treated     INH x 9 months    Diabetes type 2  Past Surgical History  Procedure Laterality Date  . Knee arthroscopy      bilateral  . Shoulder arthroscopy      right  . Colonoscopy    . Cardiac catheterization      12/14/11 good LV systolic function, mild LVH, LAD has 10-15% proximal and mid stenosis.   . Total knee revision  03/06/2012    Procedure: TOTAL KNEE REVISION; Surgeon: Dannielle HuhSteve Lucey, MD; Location: [facility] OR; Service: Orthopedics; Laterality: Right;  . Abdominal hysterectomy      Uterus Removed (Fibroids)      Current Outpatient Prescriptions  Medication Sig Dispense Refill  . aspirin 81 MG chewable tablet Chew 81 mg by mouth every morning.    . cholecalciferol (VITAMIN D) 1000 UNITS tablet Take 1,000 Units by mouth daily.    . fluconazole (DIFLUCAN) 200 MG tablet Take 1 tab po twice a week 8 tablet 0  . loratadine (CLARITIN) 10 MG tablet Take 10 mg by mouth daily.    . Metoprolol-Hydrochlorothiazide (DUTOPROL) 100-12.5 MG TB24 Take 1 tablet by mouth daily. 90 tablet 3  . metroNIDAZOLE (FLAGYL)  500 MG tablet Take 1 tab daily 30 tablet 0  . metroNIDAZOLE (METROGEL VAGINAL) 0.75 % vaginal gel Insert into the vagina in the am 70 g 2  . Multiple Vitamin (MULTIVITAMIN WITH MINERALS) TABS Take 1 tablet by mouth daily.    Marland Kitchen. nystatin (MYCOSTATIN/NYSTOP) 100000 UNIT/GM POWD Apply to genital area BID 60 g 2  . terconazole (TERAZOL 7) 0.4 % vaginal cream Place 1 applicator vaginally at bedtime. 45 g 2  . Canagliflozin-Metformin HCl [phone] MG TABS Take 1 tablet by mouth 2 (two) times daily. 60 tablet 5   No current facility-administered medications for this visit.     Allergies  Allergen Reactions  . Ibuprofen Rash  . Naproxen Rash  . Penicillins Rash    Childhood allergy      Family History  Problem Relation Age of Onset  . Cancer Mother [id number]    Lung   . Hypertension Mother   . Kidney disease Brother     On Dialysis   . Hypertension Brother   . Kidney disease Brother     S/P Kidney Transplant  . Diabetes Brother   . Hypertension Brother   . Hypertension Sister   . Cancer Sister     breast  . Hypertension Sister   . HIV/AIDS Sister      History   Social History  . Marital Status: Single    Spouse Name: N/A  Number of Children: 2  . Years of Education: 12 +    Occupational History  . FORECLOSURE COUNCELOR    Social History Main Topics  . Smoking status: Never Smoker   . Smokeless tobacco: Never Used  . Alcohol Use: No     Comment: rare  . Drug Use: No  . Sexual Activity: Yes   Other Topics Concern  . 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)    Education: Engineer, agriculturalHigh School Graduate    Tobacco Use/Exposure: None    Alcohol Use: Occasional   Drug Use: None   Diet: Regular   Exercise: None   Hobbies: Music, Reading and Chess                   REVIEW OF SYSTEMS - PERTINENT POSITIVES ONLY: Review of Systems - General ROS: negative for - chills or fever Respiratory ROS: no cough, shortness of breath, or wheezing Cardiovascular ROS: no chest pain or dyspnea on exertion Gastrointestinal ROS: no abdominal pain, change in bowel habits, or black or bloody stools Genito-Urinary ROS: no dysuria, trouble voiding, or hematuria  EXAM: BP 117/70 mmHg  Pulse 88  Temp(Src) 97.8 F (36.6 C) (Oral)  Resp 16  Ht 5\' 4"  (1.626 m)  Wt 217 lb (98.431 kg)  BMI 37.23 kg/m2  SpO2 94%   Gen: No acute distress. Well nourished and well groomed.  Neurological: Alert and oriented to person, place, and time. Coordination normal.  Head: Normocephalic and atraumatic.  Eyes: Conjunctivae are normal. Pupils are equal, round, and reactive to light. No scleral icterus.  Neck: Normal range of motion. Neck supple. No tracheal deviation or thyromegaly present. No cervical lymphadenopathy. Cardiovascular: Normal rate, regular rhythm, normal heart sounds and intact distal pulses. Respiratory: Effort normal. No respiratory distress. No chest wall tenderness. Breath sounds normal.  GI: Soft. Bowel sounds are normal. The abdomen is soft and nontender. There is no rebound and no guarding.  Musculoskeletal: Normal range of motion. Extremities are nontender.  Skin: Skin is warm and dry. No rash noted. No diaphoresis. No erythema. No pallor. No clubbing, cyanosis, or edema.  Psychiatric: Normal mood and affect. Behavior is normal. Judgment and thought content normal.    LABORATORY RESULTS: Available labs are reviewed   RADIOLOGY RESULTS:  Images and reports are reviewed.    ASSESSMENT AND PLAN:  Katelyn [name] is a 58 y.o. F with a newly diagnosed colovaginal fistula. We discussed performing a robotic sigmoidectomy to repair this. I would like her to have a colonoscopy prior to the surgery to rule out a cancer causing this fistula. I think she should stay on the flagyl for now.   The surgery and anatomy were described to the patient as well as the risks of surgery and the possible complications. These include: Bleeding, deep abdominal infections and possible wound complications such as hernia and infection, damage to adjacent structures, leak of surgical connections, which can lead to other surgeries and possibly an ostomy, possible need for other procedures, such as abscess drains in radiology, possible prolonged hospital stay, possible diarrhea from removal of part of the colon, possible constipation from narcotics, prolonged fatigue/weakness or appetite loss, possible early recurrence of of disease, possible complications of their medical problems such  as heart disease or arrhythmias or lung problems, death (less than 1%). I believe the patient understands and wishes to proceed with the surgery.     Vanita Panda, MD Colon and Rectal Surgery / General Surgery [facility]

## 2013-12-18 LAB — GLUCOSE, CAPILLARY
GLUCOSE-CAPILLARY: 152 mg/dL — AB (ref 70–99)
Glucose-Capillary: 139 mg/dL — ABNORMAL HIGH (ref 70–99)
Glucose-Capillary: 142 mg/dL — ABNORMAL HIGH (ref 70–99)

## 2013-12-18 LAB — BASIC METABOLIC PANEL
Anion gap: 11 (ref 5–15)
BUN: 20 mg/dL (ref 6–23)
CO2: 27 mEq/L (ref 19–32)
CREATININE: 1.1 mg/dL (ref 0.50–1.10)
Calcium: 8.3 mg/dL — ABNORMAL LOW (ref 8.4–10.5)
Chloride: 103 mEq/L (ref 96–112)
GFR, EST AFRICAN AMERICAN: 62 mL/min — AB (ref >90)
GFR, EST NON AFRICAN AMERICAN: 54 mL/min — AB (ref >90)
Glucose, Bld: 132 mg/dL — ABNORMAL HIGH (ref 70–99)
Potassium: 3.3 mEq/L — ABNORMAL LOW (ref 3.7–5.3)
Sodium: 141 mEq/L (ref 137–147)

## 2013-12-18 LAB — CBC
HCT: 32.3 % — ABNORMAL LOW (ref 36.0–46.0)
Hemoglobin: 10.4 g/dL — ABNORMAL LOW (ref 12.0–15.0)
MCH: 25.1 pg — AB (ref 26.0–34.0)
MCHC: 32.2 g/dL (ref 30.0–36.0)
MCV: 77.8 fL — AB (ref 78.0–100.0)
PLATELETS: 226 10*3/uL (ref 150–400)
RBC: 4.15 MIL/uL (ref 3.87–5.11)
RDW: 15.4 % (ref 11.5–15.5)
WBC: 14.5 10*3/uL — ABNORMAL HIGH (ref 4.0–10.5)

## 2013-12-18 MED ORDER — KCL IN DEXTROSE-NACL 30-5-0.45 MEQ/L-%-% IV SOLN
INTRAVENOUS | Status: DC
  Administered 2013-12-18: 1000 mL via INTRAVENOUS
  Administered 2013-12-18: 10:00:00 via INTRAVENOUS
  Administered 2013-12-19: 50 mL/h via INTRAVENOUS
  Filled 2013-12-18 (×4): qty 1000

## 2013-12-18 NOTE — Plan of Care (Signed)
Problem: Phase I Progression Outcomes Goal: Pain controlled with appropriate interventions Outcome: Completed/Met Date Met:  12/18/13 Pt has 0.57m use of Morphine PCA.  Goal: Vital signs/hemodynamically stable Outcome: Completed/Met Date Met:  12/18/13

## 2013-12-18 NOTE — Progress Notes (Signed)
1 Day Post-Op robotic assisted sigmoidectomy Subjective: Doing well. Pain overnight.  PCA helping.  Has some mild nausea this am  Objective: Vital signs in last 24 hours: Temp:  [96.3 F (35.7 C)-98.9 F (37.2 C)] 98.1 F (36.7 C) (11/17 0800) Pulse Rate:  [54-86] 70 (11/17 0800) Resp:  [0-29] 16 (11/17 0800) BP: (86-182)/(44-93) 86/51 mmHg (11/17 0800) SpO2:  [90 %-100 %] 93 % (11/17 0800)   Intake/Output from previous day: 11/16 0701 - 11/17 0700 In: 5050 [I.V.:5000; IV Piggyback:50] Out: 1125 [Urine:975; Blood:150] Intake/Output this shift:     General appearance: alert and cooperative Abd: soft Incision: slight bloody drainage noted  Lab Results:   Recent Labs  12/18/13 0355  WBC 14.5*  HGB 10.4*  HCT 32.3*  PLT 226   BMET  Recent Labs  12/18/13 0355  NA 141  K 3.3*  CL 103  CO2 27  GLUCOSE 132*  BUN 20  CREATININE 1.10  CALCIUM 8.3*   PT/INR No results for input(s): LABPROT, INR in the last 72 hours. ABG No results for input(s): PHART, HCO3 in the last 72 hours.  Invalid input(s): PCO2, PO2  MEDS, Scheduled . acetaminophen  1,000 mg Oral 4 times per day  . alvimopan  12 mg Oral BID  . enoxaparin (LOVENOX) injection  40 mg Subcutaneous Q24H  . metoprolol succinate  100 mg Oral Daily   And  . hydrochlorothiazide  12.5 mg Oral Daily  . Influenza vac split quadrivalent PF  0.5 mL Intramuscular Tomorrow-1000  . insulin aspart  0-15 Units Subcutaneous TID WC  . insulin aspart  0-5 Units Subcutaneous QHS  . morphine   Intravenous 6 times per day  . pneumococcal 23 valent vaccine  0.5 mL Intramuscular Tomorrow-1000    Studies/Results: No results found.  Assessment: s/p Procedure(s): XI ROBOT ASSISTED LAPAROSCOPIC PARTIAL COLECTOMY Patient Active Problem List   Diagnosis Date Noted  . 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 Essential hypertension, benign 06/03/2013  . Other and unspecified hyperlipidemia 06/03/2013  . Leukocytosis, unspecified  06/03/2013  . Encounter for therapeutic drug monitoring 06/03/2013  . Other malaise and fatigue 06/03/2013  . Unspecified vitamin D deficiency 06/03/2013  . IFG (impaired fasting glucose) 06/03/2013    Expected post op course  Plan: Advance diet to clears MIV  Ambulate    LOS: 1 day     .Vanita PandaAlicia C Clayborne Divis, MD [facility] [place] [phone]   12/18/2013 8:57 AM

## 2013-12-18 NOTE — Progress Notes (Signed)
RT placed pt on auto titrate CPAP 6-20cmH2O with 3L of oxygen bled in. Sterile water was added for humidification. Pt states she is comfortable and is tolerating CPAP well at this time. RT will continue to monitor as needed.

## 2013-12-18 NOTE — Care Management Note (Signed)
    Page 1 of 2   12/18/2013     11:47:58 AM CARE MANAGEMENT NOTE 12/18/2013  Patient:  Everage,Prabhnoor A   Account Number:  [id number]  Date Initiated:  12/18/2013  Documentation initiated by:  DAVIS,RHONDA  Subjective/Objective Assessment:   post op complication of apenic events requiring bipap and sdu overnight [id number] transferred to med surg floor     Action/Plan:   home when stable   Anticipated DC Date:  12/21/2013   Anticipated DC Plan:  HOME/SELF CARE  In-house referral  NA  NA      DC Planning Services  CM consult      PAC Choice  NA   Choice offered to / List presented to:  NA      DME agency  NA        HH agency  NA   Status of service:  In process, will continue to follow Medicare Important Message given?   (If response is "NO", the following Medicare IM given date fields will be blank) Date Medicare IM given:   Medicare IM given by:   Date Additional Medicare IM given:   Additional Medicare IM given by:    Discharge Disposition:    Per UR Regulation:  Reviewed for med. necessity/level of care/duration of stay  If discussed at Long Length of Stay Meetings, dates discussed:    Comments:  [id number]/Rhonda Earlene Plateravis, RN, BSN, CCM Chart reviewed. Discharge needs and patient's stay to be reviewed and followed by case manager. 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

## 2013-12-19 LAB — BASIC METABOLIC PANEL
Anion gap: 11 (ref 5–15)
BUN: 9 mg/dL (ref 6–23)
CHLORIDE: 101 meq/L (ref 96–112)
CO2: 26 meq/L (ref 19–32)
CREATININE: 0.81 mg/dL (ref 0.50–1.10)
Calcium: 8.2 mg/dL — ABNORMAL LOW (ref 8.4–10.5)
GFR calc Af Amer: >90 mL/min (ref >90)
GFR calc non Af Amer: 78 mL/min — ABNORMAL LOW (ref >90)
Glucose, Bld: 156 mg/dL — ABNORMAL HIGH (ref 70–99)
Potassium: 3.8 mEq/L (ref 3.7–5.3)
Sodium: 138 mEq/L (ref 137–147)

## 2013-12-19 LAB — CBC
HEMATOCRIT: 30.5 % — AB (ref 36.0–46.0)
HEMOGLOBIN: 10.2 g/dL — AB (ref 12.0–15.0)
MCH: 25.5 pg — AB (ref 26.0–34.0)
MCHC: 33.4 g/dL (ref 30.0–36.0)
MCV: 76.3 fL — ABNORMAL LOW (ref 78.0–100.0)
Platelets: 210 10*3/uL (ref 150–400)
RBC: 4 MIL/uL (ref 3.87–5.11)
RDW: 15.4 % (ref 11.5–15.5)
WBC: 10.8 10*3/uL — ABNORMAL HIGH (ref 4.0–10.5)

## 2013-12-19 LAB — GLUCOSE, CAPILLARY
GLUCOSE-CAPILLARY: 136 mg/dL — AB (ref 70–99)
GLUCOSE-CAPILLARY: 228 mg/dL — AB (ref 70–99)
Glucose-Capillary: 131 mg/dL — ABNORMAL HIGH (ref 70–99)
Glucose-Capillary: 122 mg/dL — ABNORMAL HIGH (ref 70–99)

## 2013-12-19 NOTE — Progress Notes (Signed)
2 Days Post-Op robotic assisted Sigmoidectomy Subjective: Doing well. Pain controlled with PCA.  Nausea better.  Tolerated clears  Objective: Vital signs in last 24 hours: Temp:  [98 F (36.7 C)-98.8 F (37.1 C)] 98.7 F (37.1 C) (11/18 0548) Pulse Rate:  [59-113] 68 (11/18 0548) Resp:  [17-29] 18 (11/18 0548) BP: (101-140)/(42-90) 120/62 mmHg (11/18 0548) SpO2:  [84 %-100 %] 98 % (11/18 0548) Weight:  [230 lb 12.8 oz (104.69 kg)] 230 lb 12.8 oz (104.69 kg) (11/18 0106)   Intake/Output from previous day: 11/17 0701 - 11/18 0700 In: 1425 [I.V.:1425] Out: 3001 [Urine:3000; Stool:1] Intake/Output this shift:     General appearance: alert and cooperative Abd: soft Incision: clean, dry, intact  Lab Results:   Recent Labs  12/18/13 0355 12/19/13 0458  WBC 14.5* 10.8*  HGB 10.4* 10.2*  HCT 32.3* 30.5*  PLT 226 210   BMET  Recent Labs  12/18/13 0355 12/19/13 0458  NA 141 138  K 3.3* 3.8  CL 103 101  CO2 27 26  GLUCOSE 132* 156*  BUN 20 9  CREATININE 1.10 0.81  CALCIUM 8.3* 8.2*   PT/INR No results for input(s): LABPROT, INR in the last 72 hours. ABG No results for input(s): PHART, HCO3 in the last 72 hours.  Invalid input(s): PCO2, PO2  MEDS, Scheduled . alvimopan  12 mg Oral BID  . enoxaparin (LOVENOX) injection  40 mg Subcutaneous Q24H  . metoprolol succinate  100 mg Oral Daily   And  . hydrochlorothiazide  12.5 mg Oral Daily  . insulin aspart  0-15 Units Subcutaneous TID WC  . insulin aspart  0-5 Units Subcutaneous QHS  . morphine   Intravenous 6 times per day    Studies/Results: No results found.  Assessment: s/p Procedure(s): XI ROBOT ASSISTED LAPAROSCOPIC PARTIAL COLECTOMY Patient Active Problem List   Diagnosis Date Noted  . 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 Essential hypertension, benign 06/03/2013  . Other and unspecified hyperlipidemia 06/03/2013  . Leukocytosis, unspecified 06/03/2013  . Encounter for therapeutic drug monitoring  06/03/2013  . Other malaise and fatigue 06/03/2013  . Unspecified vitamin D deficiency 06/03/2013  . IFG (impaired fasting glucose) 06/03/2013    Expected post op course  [facility]  Ambulate    LOS: 2 days     .Vanita PandaAlicia C Anoushka Divito, MD [facility], [place] [phone]   12/19/2013 8:34 AM

## 2013-12-20 ENCOUNTER — Encounter (HOSPITAL_COMMUNITY): Payer: Self-pay | Admitting: General Surgery

## 2013-12-20 LAB — CBC
HEMATOCRIT: 31.8 % — AB (ref 36.0–46.0)
Hemoglobin: 10.7 g/dL — ABNORMAL LOW (ref 12.0–15.0)
MCH: 25.7 pg — AB (ref 26.0–34.0)
MCHC: 33.6 g/dL (ref 30.0–36.0)
MCV: 76.3 fL — AB (ref 78.0–100.0)
Platelets: UNDETERMINED 10*3/uL (ref 150–400)
RBC: 4.17 MIL/uL (ref 3.87–5.11)
RDW: 15.6 % — ABNORMAL HIGH (ref 11.5–15.5)
WBC: 9.6 10*3/uL (ref 4.0–10.5)

## 2013-12-20 LAB — BASIC METABOLIC PANEL
Anion gap: 9 (ref 5–15)
BUN: 7 mg/dL (ref 6–23)
CHLORIDE: 99 meq/L (ref 96–112)
CO2: 29 mEq/L (ref 19–32)
CREATININE: 0.73 mg/dL (ref 0.50–1.10)
Calcium: 8.7 mg/dL (ref 8.4–10.5)
GFR calc non Af Amer: >90 mL/min (ref >90)
GLUCOSE: 129 mg/dL — AB (ref 70–99)
Potassium: 3.8 mEq/L (ref 3.7–5.3)
Sodium: 137 mEq/L (ref 137–147)

## 2013-12-20 LAB — GLUCOSE, CAPILLARY
GLUCOSE-CAPILLARY: 142 mg/dL — AB (ref 70–99)
Glucose-Capillary: 124 mg/dL — ABNORMAL HIGH (ref 70–99)
Glucose-Capillary: 156 mg/dL — ABNORMAL HIGH (ref 70–99)
Glucose-Capillary: 133 mg/dL — ABNORMAL HIGH (ref 70–99)

## 2013-12-20 MED ORDER — MORPHINE SULFATE 2 MG/ML IJ SOLN: 2.0000 mg | INTRAMUSCULAR | Status: DC | PRN

## 2013-12-20 MED ORDER — OXYCODONE-ACETAMINOPHEN 5-325 MG PO TABS
1.0000 | ORAL_TABLET | ORAL | Status: DC | PRN
  Administered 2013-12-20 – 2013-12-21 (×3): 2 via ORAL
  Filled 2013-12-20 (×3): qty 2

## 2013-12-20 NOTE — Progress Notes (Signed)
3 Days Post-Op robotic assisted Sigmoidectomy Subjective: Doing well. Pain controlled with PCA.  Tolerated full liquids  Objective: Vital signs in last 24 hours: Temp:  [97.5 F (36.4 C)-98.9 F (37.2 C)] 98.9 F (37.2 C) (11/19 0551) Pulse Rate:  [66-82] 66 (11/19 0551) Resp:  [16-20] 18 (11/19 0551) BP: (128-139)/(63-77) 136/77 mmHg (11/19 0551) SpO2:  [96 %-100 %] 96 % (11/19 0551)   Intake/Output from previous day: 11/18 0701 - 11/19 0700 In: 360 [P.O.:360] Out: 3650 [Urine:3650] Intake/Output this shift:   General appearance: alert and cooperative Abd: soft Incision: clean, dry, intact  Lab Results:   Recent Labs  12/18/13 0355 12/19/13 0458  WBC 14.5* 10.8*  HGB 10.4* 10.2*  HCT 32.3* 30.5*  PLT 226 210   BMET  Recent Labs  12/19/13 0458 12/20/13 0453  NA 138 137  K 3.8 3.8  CL 101 99  CO2 26 29  GLUCOSE 156* 129*  BUN 9 7  CREATININE 0.81 0.73  CALCIUM 8.2* 8.7   PT/INR No results for input(s): LABPROT, INR in the last 72 hours. ABG No results for input(s): PHART, HCO3 in the last 72 hours.  Invalid input(s): PCO2, PO2  MEDS, Scheduled . alvimopan  12 mg Oral BID  . enoxaparin (LOVENOX) injection  40 mg Subcutaneous Q24H  . metoprolol succinate  100 mg Oral Daily   And  . hydrochlorothiazide  12.5 mg Oral Daily  . insulin aspart  0-15 Units Subcutaneous TID WC  . insulin aspart  0-5 Units Subcutaneous QHS    Studies/Results: No results found.  Assessment: s/p Procedure(s): XI ROBOT ASSISTED LAPAROSCOPIC PARTIAL COLECTOMY Patient Active Problem List   Diagnosis Date Noted  . 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 Essential hypertension, benign 06/03/2013  . Other and unspecified hyperlipidemia 06/03/2013  . Leukocytosis, unspecified 06/03/2013  . Encounter for therapeutic drug monitoring 06/03/2013  . Other malaise and fatigue 06/03/2013  . Unspecified vitamin D deficiency 06/03/2013  . IFG (impaired fasting glucose) 06/03/2013     Expected post op course  Plan: Advance diet to soft diet SL IV Ambulate  Possible d/c tom   LOS: 3 days     .Vanita PandaAlicia C Okey Zelek, MD [facility], [place] [phone]   12/20/2013 7:32 AM

## 2013-12-20 NOTE — Anesthesia Postprocedure Evaluation (Signed)
  Anesthesia Post-op Note  Patient: Katelyn CarneyMichelle A Cordrey  Procedure(s) Performed: Procedure(s) (LRB): XI ROBOT ASSISTED LAPAROSCOPIC PARTIAL COLECTOMY (N/A)  Patient Location: PACU  Anesthesia Type: General  Level of Consciousness: awake and alert   Airway and Oxygen Therapy: Patient Spontanous Breathing  Post-op Pain: mild  Post-op Assessment: Post-op Vital signs reviewed, Patient's Cardiovascular Status Stable, requiring bipap in pacu, unable to breathe adequately on own, will go to stepdown unit overnight. Patient has untreated OSA  Last Vitals:  Filed Vitals:   12/20/13 1004  BP: 131/68  Pulse: 70  Temp: 37.1 C  Resp: 18    Post-op Vital Signs: stable   Complications: No apparent anesthesia complications

## 2013-12-20 NOTE — Evaluation (Signed)
Physical Therapy Evaluation Patient Details Name: Katelyn CarneyMichelle A Bartnik MRN: [id number] DOB: 09/09/1955 Today's Date: 12/20/2013   History of Present Illness  Katelyn [name] is a 58 y.o. female who presented with a colovaginal fistula, now s/p repair on 12/17/13.  Clinical Impression  Patient presents with decreased independence with mobility due to deficits listed in PT problem list.  She will benefit from skilled PT in the acute setting to allow return home with family assist.  No follow up PT needs at this time.  Will benefit from walker for home.    Follow Up Recommendations No PT follow up    Equipment Recommendations  Rolling walker with 5" wheels    Recommendations for Other Services       Precautions / Restrictions Precautions Precautions: Fall      Mobility  Bed Mobility Overal bed mobility: Needs Assistance Bed Mobility: Sit to Supine       Sit to supine: Min guard   General bed mobility comments: assist for feet up in bed  Transfers Overall transfer level: Needs assistance   Transfers: Sit to/from Stand Sit to Stand: Supervision         [facility]  Ambulation/Gait Ambulation/Gait assistance: Min assist Ambulation Distance (Feet): 250 Feet Assistive device: 1 person hand held assist Gait Pattern/deviations: Step-through pattern;Antalgic;Wide base of support     General Gait Details: assist and occasional use of wall rail; decreased trunk rotation  Stairs            Wheelchair Mobility    Modified Rankin (Stroke Patients Only)       Balance Overall balance assessment: Needs assistance   Sitting balance-Leahy Scale: Good       Standing balance-Leahy Scale: Fair Standing balance comment: needs hand hold for ambulation                             Pertinent Vitals/Pain Pain Assessment: 0-10 Pain Score: 4  Pain Location: right flank (up to 7-8 with coughing) Pain Intervention(s):  Monitored during session    Home Living Family/patient expects to be discharged to:: Private residence Living Arrangements: Children Available Help at Discharge: Family;Available 24 hours/day Type of Home: Apartment Home Access: Level entry     Home Layout: One level Home Equipment: None Additional Comments: moved since hadn TKA so no longer has equipment    Prior Function Level of Independence: Independent         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     Hand Dominance   Dominant Hand: Right    Extremity/Trunk Assessment               Lower Extremity Assessment: Generalized weakness         Communication   Communication: No difficulties  Cognition Arousal/Alertness: Awake/alert Behavior During Therapy: WFL for tasks assessed/performed Overall Cognitive Status: Within Functional Limits for tasks assessed                      General Comments      Exercises        Assessment/Plan    PT Assessment Patient needs continued PT services  PT Diagnosis Difficulty walking;Acute pain   PT Problem List Decreased strength;Decreased mobility;Decreased balance;Decreased knowledge of use of DME;Decreased activity tolerance;Pain;Decreased range of motion  PT Treatment Interventions DME instruction;Gait training;Functional mobility training;Therapeutic activities;Patient/family education;Balance training;Therapeutic exercise   PT Goals (Current goals can be  found in the Care Plan section) Acute Rehab PT Goals Patient Stated Goal: To go home tomorrow; return to independence PT Goal Formulation: With patient Time For Goal Achievement: 12/27/13 Potential to Achieve Goals: Good    Frequency Min 3X/week   Barriers to discharge        Co-evaluation               End of Session Equipment Utilized During Treatment: Gait belt Activity Tolerance: Patient tolerated treatment well Patient left: in bed;with call bell/phone within  reach           Time: 1415-1445 PT Time Calculation (min) (ACUTE ONLY): 30 min   Charges:   PT Evaluation $Initial PT Evaluation Tier I: 1 Procedure PT Treatments $Gait Training: 8-22 mins   PT G Codes:          WYNN,CYNDI 12/20/2013, 2:59 PM  Sheran Lawlessyndi Wynn, PT [phone] 12/20/2013

## 2013-12-21 LAB — GLUCOSE, CAPILLARY: Glucose-Capillary: 107 mg/dL — ABNORMAL HIGH (ref 70–99)

## 2013-12-21 MED ORDER — OXYCODONE-ACETAMINOPHEN 5-325 MG PO TABS: 1.0000 | ORAL_TABLET | ORAL | Status: DC | PRN

## 2013-12-21 NOTE — Discharge Summary (Signed)
Physician Discharge Summary  Patient ID: Katelyn [name] MRN: [id number] DOB/AGE: 59/04/1955 59 y.o.  Admit date: 12/17/2013 Discharge date: 12/21/2013  Admission Diagnoses: Colovaginal fistula  Discharge Diagnoses:  Active Problems:   Colovaginal fistula   Discharged Condition: good  Hospital Course: Patient admitted after surgery.  Needed BiPap overnight then was switched to CPAP at night in the hospital.  She began to have bowel function on POD 2 and her diet was advanced.  Her foley was removed on POD 2.  By POD 4 she was tolerating a diet, having BM's, ambulating without significant difficulty and her pain was controlled with PO narcotics.    Consults: None  Significant Diagnostic Studies: labs: cbc, chemistry  Treatments: IV hydration, analgesia: acetaminophen w/ codeine and surgery: robotic sigmoidectomy  Discharge Exam: Blood pressure 131/70, pulse 75, temperature 97.8 F (36.6 C), temperature source Oral, resp. rate 18, height 5\' 4"  (1.626 m), weight 230 lb 12.8 oz (104.69 kg), SpO2 97 %. General appearance: alert and cooperative GI: normal findings: soft, non-tender Incision/Wound: clean, dry, intact  Disposition: 01-Home or Self Care     Medication List    STOP taking these medications        metroNIDAZOLE 0.75 % vaginal gel  Commonly known as:  METROGEL VAGINAL     metroNIDAZOLE 500 MG tablet  Commonly known as:  FLAGYL      TAKE these medications        aspirin 81 MG chewable tablet  Chew 81 mg by mouth every morning.     Canagliflozin-Metformin HCl [phone] MG Tabs  Take 1 tablet by mouth 2 (two) times daily.     cholecalciferol 1000 UNITS tablet  Commonly known as:  VITAMIN D  Take 1,000 Units by mouth daily.     fluconazole 200 MG tablet  Commonly known as:  DIFLUCAN  Take 1 tab po twice a week     loratadine 10 MG tablet  Commonly known as:  CLARITIN  Take 10 mg by mouth daily.     Metoprolol-Hydrochlorothiazide 100-12.5 MG Tb24   Commonly known as:  DUTOPROL  Take 1 tablet by mouth daily.     multivitamin with minerals Tabs tablet  Take 1 tablet by mouth daily.     nystatin 100000 UNIT/GM Powd  Apply to genital area BID     oxyCODONE-acetaminophen 5-325 MG per tablet  Commonly known as:  PERCOCET/ROXICET  Take 1-2 tablets by mouth every 4 (four) hours as needed for moderate pain.     terconazole 0.4 % vaginal cream  Commonly known as:  TERAZOL 7  Place 1 applicator vaginally at bedtime.           Follow-up Information    Follow up with Vanita PandaHOMAS, Encarnacion Scioneaux C., MD. Schedule an appointment as soon as possible for a visit in 2 weeks.   Specialty:  General Surgery   Contact information:   [address] [place] [address] [phone]       Signed: Vanita PandaHOMAS, Remmington Teters C. 12/21/2013, 7:44 AM

## 2013-12-21 NOTE — Plan of Care (Signed)
Problem: Discharge Progression Outcomes Goal: Barriers To Progression Addressed/Resolved Outcome: Completed/Met Date Met:  12/21/13 Goal: Discharge plan in place and appropriate Outcome: Completed/Met Date Met:  12/21/13 Goal: Pain controlled with appropriate interventions Outcome: Adequate for Discharge Goal: Hemodynamically stable Outcome: Completed/Met Date Met:  67/54/49 Goal: Complications resolved/controlled Outcome: Adequate for Discharge Goal: Tolerating diet Outcome: Completed/Met Date Met:  12/21/13 Goal: Activity appropriate for discharge plan Outcome: Adequate for Discharge Goal: Tubes and drains discontinued if indicated Outcome: Completed/Met Date Met:  12/21/13 Goal: Staples/sutures removed Outcome: Not Applicable Date Met:  20/10/07 Goal: Steri-Strips applied Outcome: Not Applicable Date Met:  01/19/74 Goal: Other Discharge Outcomes/Goals Outcome: Completed/Met Date Met:  12/21/13

## 2013-12-21 NOTE — Progress Notes (Signed)
[facility] providing the following [facility]: RW  If patient discharges after hours, please call [phone].   Renard HamperLecretia Williamson 12/21/2013, 10:34 AM

## 2013-12-21 NOTE — Discharge Instructions (Signed)

## 2013-12-31 ENCOUNTER — Other Ambulatory Visit (HOSPITAL_COMMUNITY): Payer: Self-pay | Admitting: Family Medicine

## 2013-12-31 DIAGNOSIS — Z1231 Encounter for screening mammogram for malignant neoplasm of breast: Secondary | ICD-10-CM

## 2014-01-15 ENCOUNTER — Ambulatory Visit (HOSPITAL_COMMUNITY)
Admission: RE | Admit: 2014-01-15 | Discharge: 2014-01-15 | Disposition: A | Discharge status: Home / Self Care | Payer: 59 | Source: Ambulatory Visit | Attending: Family Medicine | Admitting: Family Medicine

## 2014-01-15 DIAGNOSIS — Z1231 Encounter for screening mammogram for malignant neoplasm of breast: Secondary | ICD-10-CM

## 2014-01-17 ENCOUNTER — Other Ambulatory Visit: Payer: Self-pay | Admitting: Family Medicine

## 2014-01-17 DIAGNOSIS — R928 Other abnormal and inconclusive findings on diagnostic imaging of breast: Secondary | ICD-10-CM

## 2014-01-22 ENCOUNTER — Other Ambulatory Visit: Payer: 59

## 2014-02-04 ENCOUNTER — Ambulatory Visit
Admission: RE | Admit: 2014-02-04 | Discharge: 2014-02-04 | Disposition: A | Discharge status: Home / Self Care | Payer: 59 | Source: Ambulatory Visit | Attending: Family Medicine | Admitting: Family Medicine

## 2014-02-04 DIAGNOSIS — R928 Other abnormal and inconclusive findings on diagnostic imaging of breast: Secondary | ICD-10-CM

## 2014-05-10 IMAGING — CR DG CHEST 2V
2 series · 2 of 2 positions shown · non-contrast
Comparison: 11/15/2007

CLINICAL DATA: Heart murmur, hypertension, preop knee surgery

CHEST - 2 VIEW

[view not recorded (1 of 2)]
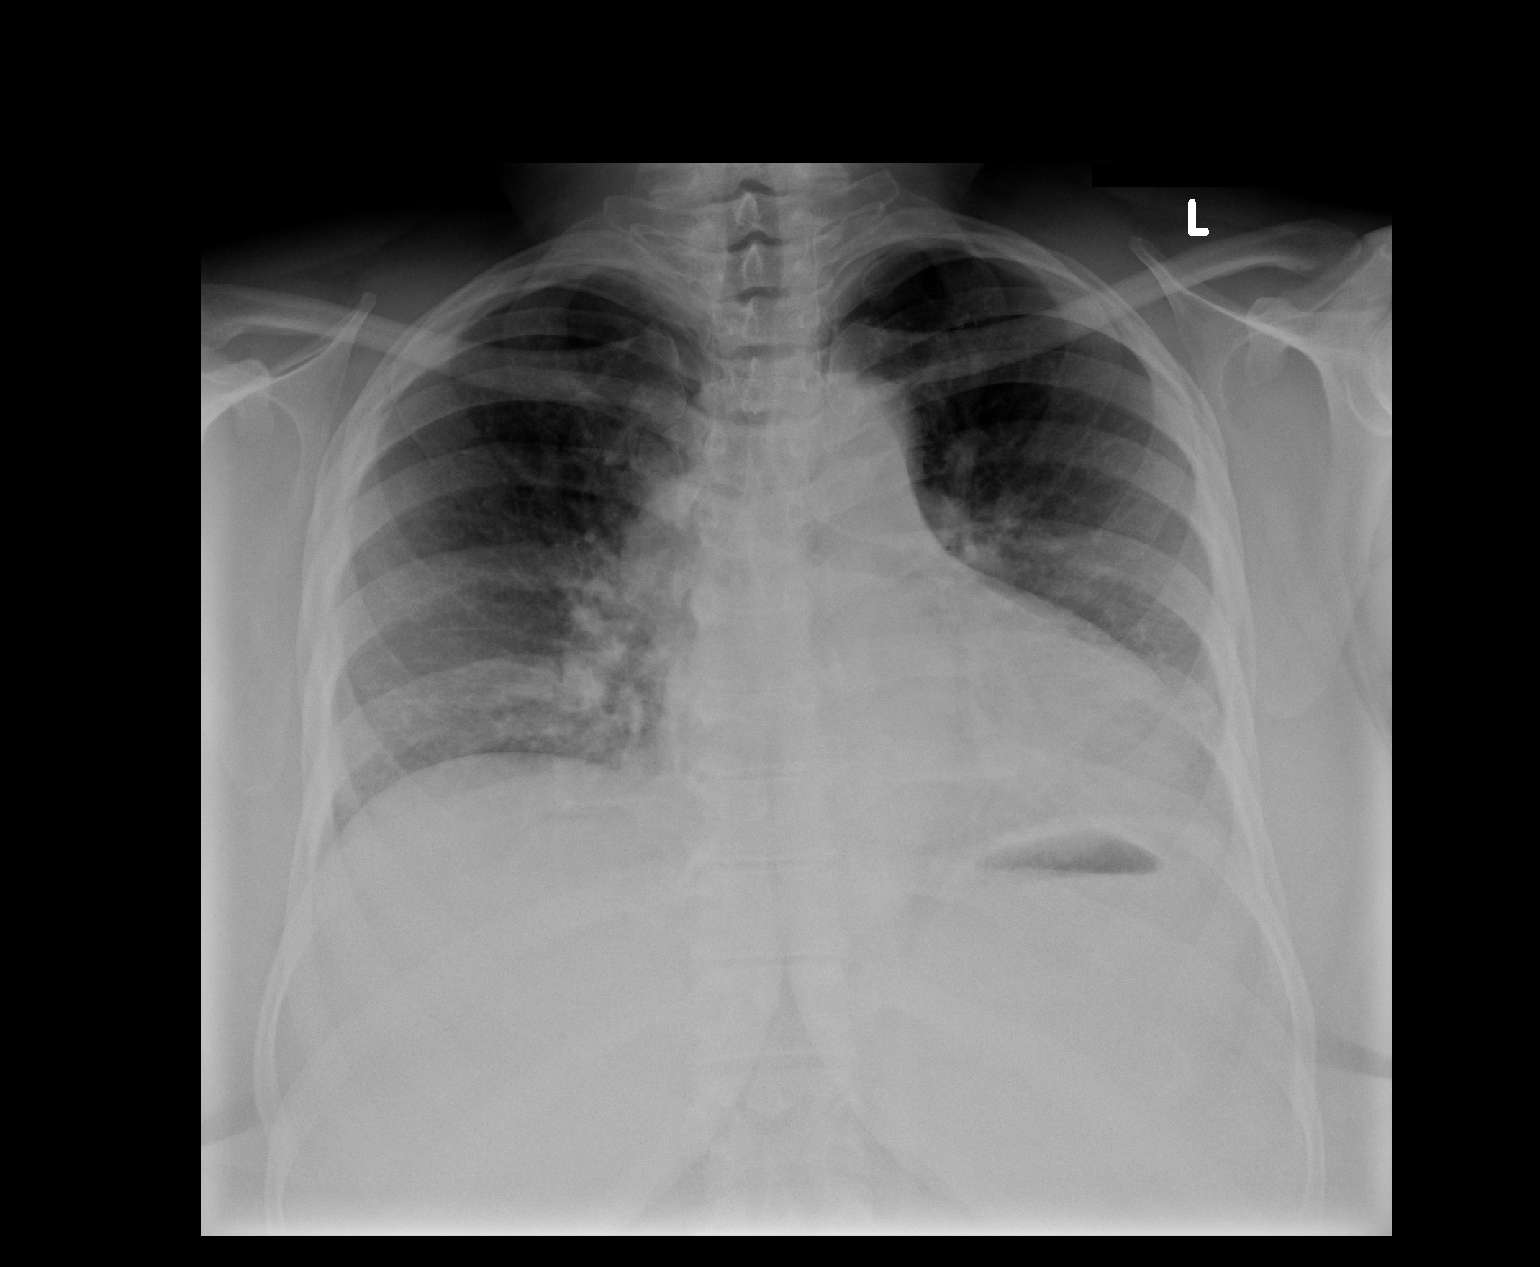

[view not recorded (2 of 2)]
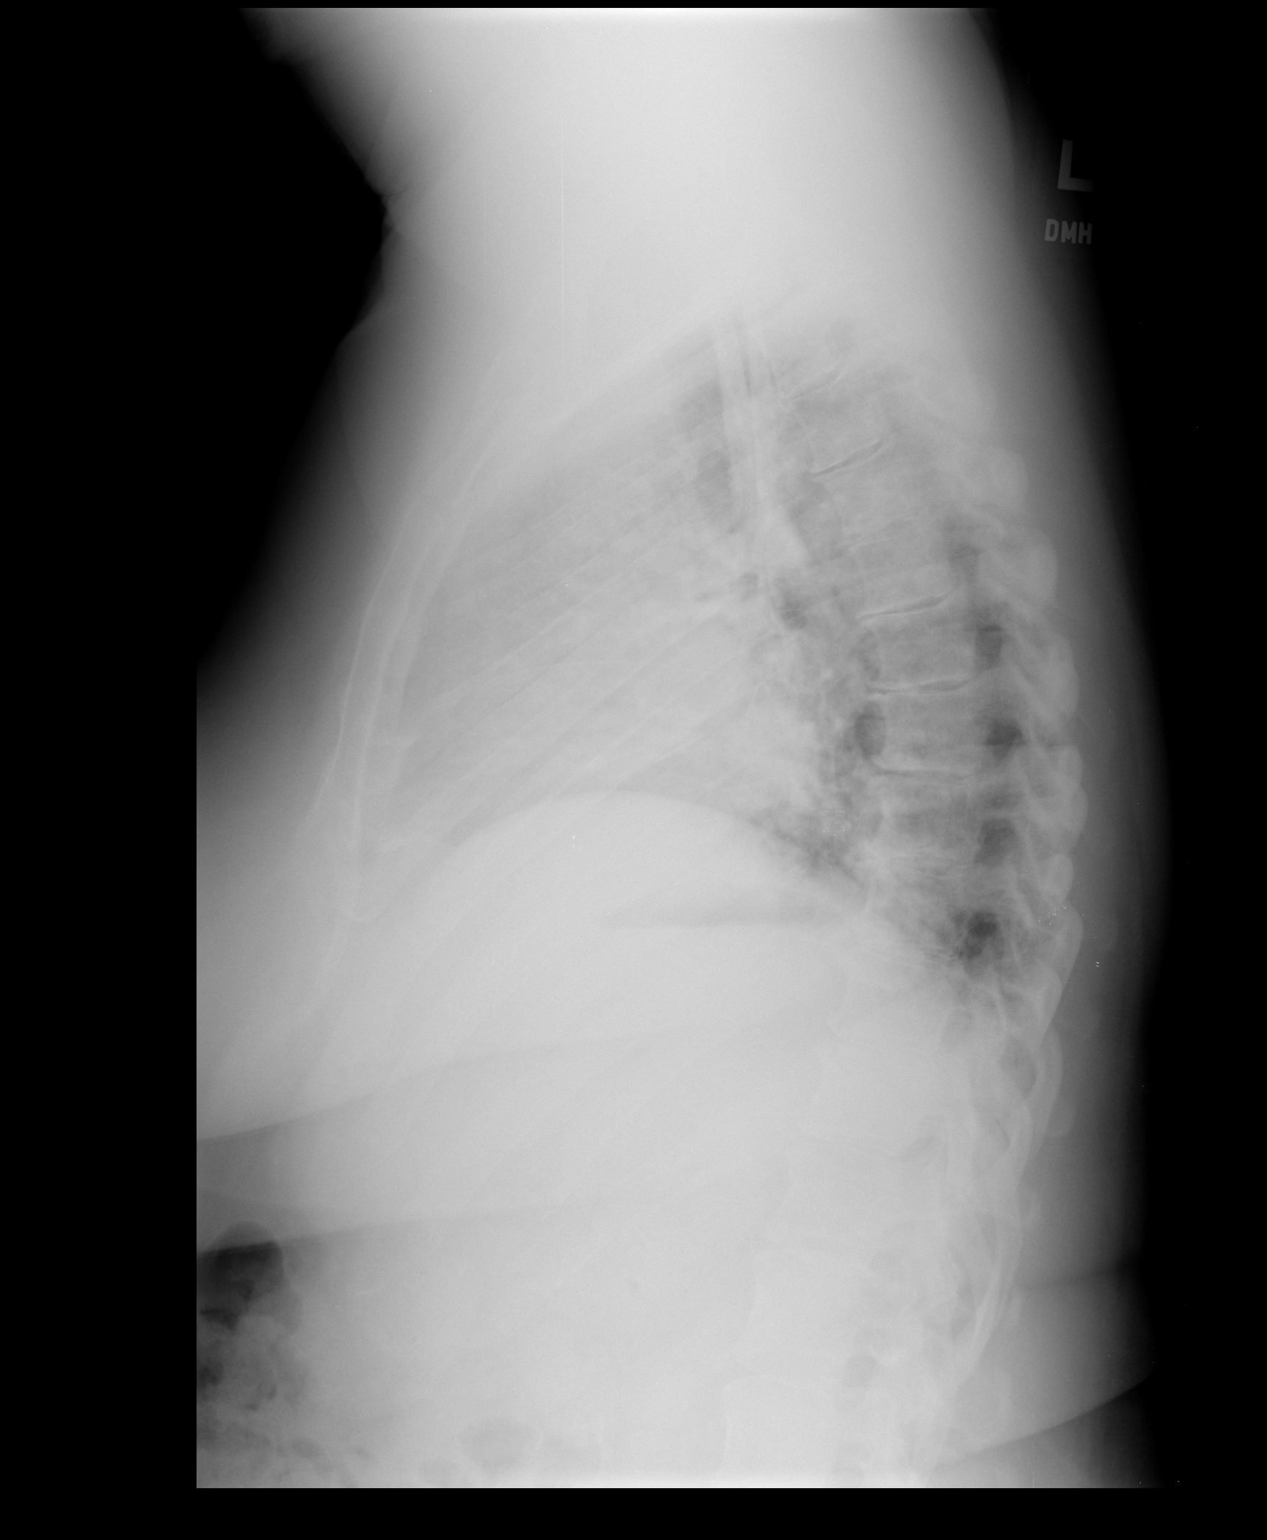

[2 of 2 positions shown; findings below may reference images not displayed]

FINDINGS: Relatively low lung volumes.  New cardiomegaly.  Crowding
of bronchovascular structures in both lung bases with question of
mild central pulmonary vascular congestion.  No effusion.  Spurring
in the lower thoracic spine.
IMPRESSION: 1.  New cardiomegaly.
2.  Low volumes with bibasilar subsegmental atelectasis.

## 2014-05-23 ENCOUNTER — Institutional Professional Consult (permissible substitution): Payer: Self-pay | Admitting: Pulmonary Disease

## 2014-06-04 ENCOUNTER — Encounter: Payer: Self-pay | Admitting: Pulmonary Disease

## 2014-06-04 ENCOUNTER — Ambulatory Visit (INDEPENDENT_AMBULATORY_CARE_PROVIDER_SITE_OTHER): Payer: 59 | Admitting: Pulmonary Disease

## 2014-06-04 ENCOUNTER — Encounter (INDEPENDENT_AMBULATORY_CARE_PROVIDER_SITE_OTHER): Payer: Self-pay

## 2014-06-04 VITALS — BP 140/86 | HR 62 | Ht 64.0 in | Wt 217.2 lb

## 2014-06-04 DIAGNOSIS — G4733 Obstructive sleep apnea (adult) (pediatric): Secondary | ICD-10-CM | POA: Diagnosis not present

## 2014-06-04 NOTE — Progress Notes (Signed)
   Subjective:    Patient ID: Lupe CarneyMichelle A Rawe, female    DOB: 06/29/1955, 59 y.o.   MRN: [id number]  HPI    Review of Systems  Constitutional: Negative for fever, chills and unexpected weight change.  HENT: Negative for congestion, dental problem, ear pain, nosebleeds, postnasal drip, rhinorrhea, sinus pressure, sneezing, sore throat, trouble swallowing and voice change.   Eyes: Negative for visual disturbance.  Respiratory: Negative for cough, choking and shortness of breath.   Cardiovascular: Negative for chest pain and leg swelling.  Gastrointestinal: Negative for vomiting, abdominal pain and diarrhea.  Genitourinary: Negative for difficulty urinating.  Musculoskeletal: Negative for arthralgias.  Skin: Negative for rash.  Neurological: Negative for tremors, syncope and headaches.  Hematological: Does not bruise/bleed easily.       Objective:   Physical Exam        Assessment & Plan:

## 2014-06-04 NOTE — Progress Notes (Signed)
Subjective:    Patient ID: Katelyn [name], female    DOB: 05/20/1955, 59 y.o.   MRN: [id number]  HPI  Chief Complaint  Patient presents with  . Sleep Consult    Referred by [facility].  Sleep Study done in 2008.  Patient does not wear CPAP currently, but says she needs one.  Snoring. Stops breathing at night during sleep.  Epworth Score: 868   59 [facility], presents for evaluation of OSA 08/2006 PSG - wt 216 lbs - AHI 16/h She did not start on CPAP therapy then. Epworth sleepiness score is 7. She reports loud snoring noted by family members, she can fall asleep at the drop of a hat. She does not sleep and rides the bus and is often falling asleep there. After recent surgery for rectovaginal fistula, she did not recover well and required CPAP therapy. Bedtime is around 11 PM, she falls asleep instantly, sleeps on her right side with 2 pillows, reports 2-3 nocturnal awakenings including bathroom visits and is out of bed by 6 AM feeling tired with occasional dryness of mouth and no headaches. She drinks 2-3 cups of coffee daily. There is no history suggestive of cataplexy, sleep paralysis or parasomnias She reports nocturia but attributes this to diuretic    Past Medical History  Diagnosis Date  . Hypertension   . Heart murmur   . Seasonal allergies   . Obesity   . Positive PPD, treated     INH x 9 months   . Sleep apnea     no CPAP  . Borderline diabetes   . Osteoarthritis    Allergies  Allergen Reactions  . Ibuprofen Rash  . Naproxen Rash  . Penicillins Rash    Childhood allergy     History   Social History  . Marital Status: Single    Spouse Name: N/A  . Number of Children: 2  . Years of Education: 12 +    Occupational History  . FORECLOSURE COUNCELOR    Social History Main Topics  . Smoking status: Never Smoker   . Smokeless tobacco: Never Used  . Alcohol Use: Yes     Comment: occasional  . Drug Use: No     Comment: none since  1989  . Sexual Activity: Yes   Other Topics Concern  . 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 Cancer Mother [id number]    Lung   . Hypertension Mother   . Kidney disease Brother     On Dialysis   . Hypertension Brother   . Kidney disease Brother     S/P Kidney Transplant  . Diabetes Brother   . Hypertension Brother   . Hypertension Sister   . Cancer Sister     breast  . Hypertension Sister   . HIV/AIDS  Sister      Review of Systems neg for any significant sore throat, dysphagia, itching, sneezing, nasal congestion or excess/ purulent secretions, fever, chills, sweats, unintended wt loss, pleuritic or exertional cp, hempoptysis, orthopnea pnd or change in chronic leg swelling. Also denies presyncope, palpitations, heartburn, abdominal pain, nausea, vomiting, diarrhea or change in bowel or urinary habits, dysuria,hematuria, rash, arthralgias, visual complaints, headache, numbness weakness or ataxia.      Objective:   Physical Exam  Gen. Pleasant, obese, in no distress, normal affect ENT - no lesions, no post nasal drip, class 2-3 airway Neck: No JVD, no thyromegaly, no carotid bruits Lungs: no use of accessory muscles, no dullness to percussion, decreased without rales or rhonchi  Cardiovascular: Rhythm regular, heart sounds  normal, no murmurs or gallops, no peripheral edema Abdomen: soft and non-tender, no hepatosplenomegaly, BS normal. Musculoskeletal: No deformities, no cyanosis  or clubbing Neuro:  alert, non focal, no tremors       Assessment & Plan:

## 2014-06-04 NOTE — Assessment & Plan Note (Addendum)
The pathophysiology of obstructive sleep apnea , it's cardiovascular consequences & modes of treatment including CPAP were discused with the patient in detail & they evidenced understanding.  Weight loss encouraged, compliance with goal of at least 4-6 hrs every night is the expectation. Advised against medications with sedative side effects Cautioned against driving when sleepy - understanding that sleepiness will vary on a day to day basis  CPAP titration study will be scheduled based on the she'll be started on the CPAP machine

## 2014-06-04 NOTE — Patient Instructions (Addendum)
CPAP titration study -based on this we will set you up with a CPAP machine

## 2014-06-09 ENCOUNTER — Ambulatory Visit (HOSPITAL_BASED_OUTPATIENT_CLINIC_OR_DEPARTMENT_OTHER): Payer: 59 | Attending: Pulmonary Disease

## 2014-06-09 VITALS — Ht 64.0 in | Wt 217.0 lb

## 2014-06-09 DIAGNOSIS — G4733 Obstructive sleep apnea (adult) (pediatric): Secondary | ICD-10-CM | POA: Diagnosis not present

## 2014-06-09 DIAGNOSIS — Z9989 Dependence on other enabling machines and devices: Secondary | ICD-10-CM

## 2014-06-17 ENCOUNTER — Telehealth: Payer: Self-pay | Admitting: Pulmonary Disease

## 2014-06-17 DIAGNOSIS — G4733 Obstructive sleep apnea (adult) (pediatric): Secondary | ICD-10-CM

## 2014-06-17 DIAGNOSIS — G4730 Sleep apnea, unspecified: Secondary | ICD-10-CM | POA: Diagnosis not present

## 2014-06-17 NOTE — Telephone Encounter (Signed)
Pt returning call [phone]

## 2014-06-17 NOTE — Sleep Study (Signed)
[facility]  NAME: Katelyn CarneyMichelle A Whipple  DATE OF BIRTH: 02/27/1955  MEDICAL RECORD NUMBER [id number]  LOCATION: [facility]  PHYSICIAN: Gerold Sar V.  DATE OF STUDY: 06/09/14   SLEEP STUDY TYPE: CPAP titration study               REFERRING PHYSICIAN: Oretha MilchAlva, Mal[place] Eppard V, MD  INDICATION FOR STUDY: 59 year old with moderate OSA. PSG in 2008 when her weight was 216 pounds showed AHI of 16/h. At the time of this study ,they weighed 217 pounds with a height of  5 ft 4 inches and the BMI of 37, neck size of 16 inches. Epworth sleepiness score was 20   This CPAP titration polysomnogram was performed with a sleep technologist in attendance. EEG, EOG,EMG and respiratory parameters recorded. Sleep stages, arousals, limb movements and respiratory data was scored according to criteria laid out by the American Academy of sleep medicine.  SLEEP ARCHITECTURE: Lights out was at 2243 PM and lights on was at 456 AM. Total sleep time was 350 minutes with sleep period time of 371 minutes and sleep efficiency of 94% .Sleep latency was 2 minutes with latency to REM sleep of 45 minutes and wake after sleep onset of 21 minutes.  Sleep stages as a percentage of total sleep time was N1 1.3% N2- 67 % and REM sleep 32 % ( 111 minutes) . The longest period of REM sleep was around 4 AM.   AROUSAL DATA : There were 16 arousals with an arousal index of 2.7 events per hour. Of these 14 were spontaneous, and to were associated with respiratory events and 0 were associated periodic limb movements  RESPIRATORY DATA: CPAP was initiated at 5 centimeters and titrated to a final level of 13 centimeters due to respiratory events and snoring. At the final level of 13 centimeters for 56 minutes, there were 0 obstructive apneas, 0 central apneas, 0 mixed apneas and 0 hypopneas with apnea -hypopnea index of 0 events per hour.  There was no relation to sleep stage or body position. Titration was  optimal.  MOVEMENT/PARASOMNIA: There were 0 PLMS with a PLM index of 0 events per hour. The PLM arousal index was 0 events per hour.  OXYGEN DATA: The lowest desaturation was 83 % during REM sleep and the desaturation index was 5 per hour. The saturations stayed below 88% for 2 minutes.  CARDIAC DATA: The low heart rate was 39 beats per minute. The high heart rate recorded was an artifact. No arrhythmias were noted   IMPRESSION :  1. Moderate obstructive sleep apnea with hypopneas causing sleep fragmentation and mild oxygen desaturation. 2. This was corrected by CPAP of 13 centimeters with a medium fullface mask. Titration was optimal. 3. No evidence of cardiac arrhythmias or behavioral disturbance during sleep. 4. Periodic limb movements were not noted.  RECOMMENDATION:    1. The treatment options for this degree of sleep disordered breathing includes weight loss and/or CPAP therapy. CPAP can be initiated at 13 centimeters with a medium fullface mask and compliance monitored at this level. 2. Patient should be cautioned against driving when sleepy 3. They should be asked to avoid medications with sedative side effects  Oretha MilchALVA,Vic Esco V.  MD Diplomate, American Board of Sleep Medicine  ELECTRONICALLY SIGNED ON: 06/17/2014   [facility] PH: [phone]   FX: [phone] ACCREDITED BY THE AMERICAN ACADEMY OF SLEEP MEDICINE

## 2014-06-17 NOTE — Telephone Encounter (Signed)
Prescriptions sent to DME for -CPAP of 13 centimeters with a medium fullface mask. Please arrange office visit in 6 weeks with TP

## 2014-06-17 NOTE — Telephone Encounter (Signed)
lmtcb

## 2014-06-17 NOTE — Telephone Encounter (Signed)
lmtcb x2 

## 2014-06-18 NOTE — Telephone Encounter (Signed)
Pt returned call  [phone]

## 2014-06-18 NOTE — Telephone Encounter (Signed)
Pt is aware of order. ROV has been scheduled with TP on 08/06/14 at 4:30pm. Nothing further was needed.

## 2014-08-06 ENCOUNTER — Encounter (INDEPENDENT_AMBULATORY_CARE_PROVIDER_SITE_OTHER): Payer: Self-pay

## 2014-08-06 ENCOUNTER — Ambulatory Visit (INDEPENDENT_AMBULATORY_CARE_PROVIDER_SITE_OTHER): Payer: 59 | Admitting: Adult Health

## 2014-08-06 ENCOUNTER — Encounter: Payer: Self-pay | Admitting: Adult Health

## 2014-08-06 VITALS — BP 150/94 | HR 63 | Temp 98.3°F | Ht 64.0 in | Wt 230.0 lb

## 2014-08-06 DIAGNOSIS — G4733 Obstructive sleep apnea (adult) (pediatric): Secondary | ICD-10-CM | POA: Diagnosis not present

## 2014-08-06 NOTE — Progress Notes (Signed)
   Subjective:    Patient ID: Katelyn [name], female    DOB: 03/19/1955, 59 y.o.   MRN: [id number]  HPI 59 yo female seen for sleep consult 06/04/14 with Dr. Vassie LollAlva    08/06/2014 Follow up OSA  Pt returns for follow up for sleep apnea.  She was previously dx with OSA in 2008 with sleep study /AHI 16 /hr  She did not use CPAP  Returns with persistent symptoms.  Set up for CPAP titration study .  Started on CPAP 13 cm .  Returns today feeling better  Less tired , more energy.  Less daytime sleepiness.  Download shows good compliance at 80%, avg 6hrs min leaks AHI 2.4/hr  No chest pain, edema or dyspnea.  Wants to try nasal pillows.    Review of Systems See hpi     Objective:   Physical Exam GEN: A/Ox3; pleasant , NAD  HEENT:  [place]/AT,  EACs-clear, TMs-wnl, NOSE-clear, THROAT-clear, no lesions, no postnasal drip or exudate noted.   NECK:  Supple w/ fair ROM; no JVD; normal carotid impulses w/o bruits; no thyromegaly or nodules palpated; no lymphadenopathy.  RESP  Clear  P & A; w/o, wheezes/ rales/ or rhonchi.no accessory muscle use, no dullness to percussion  CARD:  RRR, no m/r/g  , no peripheral edema, pulses intact, no cyanosis or clubbing.  GI:   Soft & nt; nml bowel sounds; no organomegaly or masses detected.  Musco: Warm bil, no deformities or joint swelling noted.   Neuro: alert, no focal deficits noted.    Skin: Warm, no lesions or rashes         Assessment & Plan:

## 2014-08-06 NOTE — Patient Instructions (Signed)
Keep up good work with CPAP  May try nasal pillows  Work on weight loss  follow up Dr. Vassie LollAlva  In 6 months and As needed

## 2014-08-06 NOTE — Assessment & Plan Note (Signed)
Improved control with CPAP   Plan  Keep up good work with CPAP  May try nasal pillows  Work on weight loss  follow up Dr. Vassie LollAlva  In 6 months and As needed

## 2014-08-08 NOTE — Progress Notes (Signed)
Reviewed & agree with plan  

## 2014-08-09 ENCOUNTER — Encounter (HOSPITAL_BASED_OUTPATIENT_CLINIC_OR_DEPARTMENT_OTHER): Payer: 59

## 2014-08-16 ENCOUNTER — Encounter: Payer: Self-pay | Admitting: Pulmonary Disease

## 2015-05-19 ENCOUNTER — Other Ambulatory Visit: Payer: Self-pay

## 2015-05-19 DIAGNOSIS — Z1231 Encounter for screening mammogram for malignant neoplasm of breast: Secondary | ICD-10-CM

## 2015-06-18 ENCOUNTER — Ambulatory Visit: Payer: 59

## 2015-07-01 ENCOUNTER — Ambulatory Visit: Payer: 59

## 2015-07-18 ENCOUNTER — Ambulatory Visit: Payer: 59

## 2015-07-28 ENCOUNTER — Ambulatory Visit
Admission: RE | Admit: 2015-07-28 | Discharge: 2015-07-28 | Disposition: A | Discharge status: Home / Self Care | Payer: Commercial Managed Care - HMO | Source: Ambulatory Visit

## 2015-07-28 DIAGNOSIS — Z1231 Encounter for screening mammogram for malignant neoplasm of breast: Secondary | ICD-10-CM

## 2015-08-01 ENCOUNTER — Ambulatory Visit: Payer: 59

## 2016-08-09 ENCOUNTER — Other Ambulatory Visit: Payer: Self-pay | Admitting: Obstetrics and Gynecology

## 2016-08-09 DIAGNOSIS — Z1231 Encounter for screening mammogram for malignant neoplasm of breast: Secondary | ICD-10-CM

## 2016-08-24 ENCOUNTER — Ambulatory Visit (HOSPITAL_COMMUNITY): Payer: Commercial Managed Care - HMO

## 2016-09-16 ENCOUNTER — Ambulatory Visit (HOSPITAL_COMMUNITY)
Admission: RE | Admit: 2016-09-16 | Discharge: 2016-09-16 | Disposition: A | Discharge status: Home / Self Care | Payer: Self-pay | Source: Ambulatory Visit | Attending: Obstetrics and Gynecology | Admitting: Obstetrics and Gynecology

## 2016-09-16 ENCOUNTER — Ambulatory Visit
Admission: RE | Admit: 2016-09-16 | Discharge: 2016-09-16 | Disposition: A | Discharge status: Home / Self Care | Payer: No Typology Code available for payment source | Source: Ambulatory Visit | Attending: Obstetrics and Gynecology | Admitting: Obstetrics and Gynecology

## 2016-09-16 ENCOUNTER — Encounter (HOSPITAL_COMMUNITY): Payer: Self-pay

## 2016-09-16 VITALS — BP 124/80 | Temp 98.4°F | Ht 64.0 in | Wt 229.0 lb

## 2016-09-16 DIAGNOSIS — Z1239 Encounter for other screening for malignant neoplasm of breast: Secondary | ICD-10-CM

## 2016-09-16 DIAGNOSIS — Z1231 Encounter for screening mammogram for malignant neoplasm of breast: Secondary | ICD-10-CM

## 2016-09-16 NOTE — Progress Notes (Signed)
No complaints today.   Pap Smear: Pap smear not completed today. Last Pap smear was in 2016 at [facility] and normal per patient. Per patient has no history of an abnormal Pap smear. Patient has a history of a hysterectomy in 1998 for fibroids. Patient no longer needs Pap smears due to her history of a hysterectomy for benign reasons per BCCCP and ACOG guidelines. No Pap smear results are in EPIC.  Physical exam: Breasts Breasts symmetrical. No skin abnormalities bilateral breasts. No nipple retraction bilateral breasts. No nipple discharge bilateral breasts. No lymphadenopathy. No lumps palpated bilateral breasts. No complaints of pain or tenderness on exam. [facility] for [facility] screening mamm[facility]. Appointment scheduled for Thursday, September 16, 2016 at 1240.       Pelvic/Bimanual No Pap smear completed today since patient has a history of a hysterectomy for benign reasons. Pap smear not indicated per BCCCP guidelines.   Smoking History: Patient has never smoked.  Patient Navigation: Patient education provided. Access to services provided for patient through [facility].   Colorectal Cancer Screening: Per patient had a colonoscopy completed in 2016. No complaints today. FIT Test given to patient to complete and return to B[facility]CP.

## 2016-09-16 NOTE — Patient Instructions (Signed)
Explained breast self awareness with Katelyn CarneyMichelle A [name]. Patient did not need a Pap smear today due to her history of a hysterectomy for benign reasons. Let patient know that she doesn't need any further Pap smears due to her history of a hysterectomy for benign reasons. [facility]erred [facility] to [facility] for a screening mamm[facility]. Appointment scheduled for Thursday, September 16, 2016 at 1240. Let patient know will follow up with her within the next couple weeks with results. Katelyn CarneyMichelle A [name] verbalized understanding.  [place], Kathaleen Maserhristine Poll, RN 10:32 AM

## 2016-11-30 ENCOUNTER — Other Ambulatory Visit: Payer: Self-pay | Admitting: Obstetrics and Gynecology

## 2016-12-08 ENCOUNTER — Encounter (HOSPITAL_COMMUNITY): Payer: Self-pay | Admitting: *Deleted

## 2016-12-08 LAB — FECAL OCCULT BLOOD, IMMUNOCHEMICAL: Fecal Occult Bld: NEGATIVE

## 2020-03-13 ENCOUNTER — Ambulatory Visit: Payer: 59 | Admitting: Podiatry

## 2020-03-17 ENCOUNTER — Ambulatory Visit (INDEPENDENT_AMBULATORY_CARE_PROVIDER_SITE_OTHER): Payer: 59 | Admitting: Podiatry

## 2020-03-17 ENCOUNTER — Other Ambulatory Visit: Payer: Self-pay

## 2020-03-17 DIAGNOSIS — M79674 Pain in right toe(s): Secondary | ICD-10-CM | POA: Diagnosis not present

## 2020-03-17 DIAGNOSIS — M79675 Pain in left toe(s): Secondary | ICD-10-CM

## 2020-03-17 DIAGNOSIS — B351 Tinea unguium: Secondary | ICD-10-CM | POA: Diagnosis not present

## 2020-03-17 DIAGNOSIS — E119 Type 2 diabetes mellitus without complications: Secondary | ICD-10-CM

## 2020-03-17 DIAGNOSIS — E1121 Type 2 diabetes mellitus with diabetic nephropathy: Secondary | ICD-10-CM | POA: Diagnosis not present

## 2020-03-17 MED ORDER — CICLOPIROX 8 % EX SOLN: Freq: Every day | CUTANEOUS | 2 refills | Status: DC

## 2020-03-17 NOTE — Patient Instructions (Signed)
Diabetes Mellitus and Foot Care Foot care is an important part of your health, especially when you have diabetes. Diabetes may cause you to have problems because of poor blood flow (circulation) to your feet and legs, which can cause your skin to:  Become thinner and drier.  Break more easily.  Heal more slowly.  Peel and crack. You may also have nerve damage (neuropathy) in your legs and feet, causing decreased feeling in them. This means that you may not notice minor injuries to your feet that could lead to more serious problems. Noticing and addressing any potential problems early is the best way to prevent future foot problems. How to care for your feet Foot hygiene  Wash your feet daily with warm water and mild soap. Do not use hot water. Then, pat your feet and the areas between your toes until they are completely dry. Do not soak your feet as this can dry your skin.  Trim your toenails straight across. Do not dig under them or around the cuticle. File the edges of your nails with an emery board or nail file.  Apply a moisturizing lotion or petroleum jelly to the skin on your feet and to dry, brittle toenails. Use lotion that does not contain alcohol and is unscented. Do not apply lotion between your toes.   Shoes and socks  Wear clean socks or stockings every day. Make sure they are not too tight. Do not wear knee-high stockings since they may decrease blood flow to your legs.  Wear shoes that fit properly and have enough cushioning. Always look in your shoes before you put them on to be sure there are no objects inside.  To break in new shoes, wear them for just a few hours a day. This prevents injuries on your feet. Wounds, scrapes, corns, and calluses  Check your feet daily for blisters, cuts, bruises, sores, and redness. If you cannot see the bottom of your feet, use a mirror or ask someone for help.  Do not cut corns or calluses or try to remove them with medicine.  If you  find a minor scrape, cut, or break in the skin on your feet, keep it and the skin around it clean and dry. You may clean these areas with mild soap and water. Do not clean the area with peroxide, alcohol, or iodine.  If you have a wound, scrape, corn, or callus on your foot, look at it several times a day to make sure it is healing and not infected. Check for: ? Redness, swelling, or pain. ? Fluid or blood. ? Warmth. ? Pus or a bad smell.   General tips  Do not cross your legs. This may decrease blood flow to your feet.  Do not use heating pads or hot water bottles on your feet. They may burn your skin. If you have lost feeling in your feet or legs, you may not know this is happening until it is too late.  Protect your feet from hot and cold by wearing shoes, such as at the beach or on hot pavement.  Schedule a complete foot exam at least once a year (annually) or more often if you have foot problems. Report any cuts, sores, or bruises to your health care provider immediately. Where to find more information  American Diabetes Association: www.diabetes.org  Association of Diabetes Care & Education Specialists: www.diabeteseducator.org Contact a health care provider if:  You have a medical condition that increases your risk of infection and   you have any cuts, sores, or bruises on your feet.  You have an injury that is not healing.  You have redness on your legs or feet.  You feel burning or tingling in your legs or feet.  You have pain or cramps in your legs and feet.  Your legs or feet are numb.  Your feet always feel cold.  You have pain around any toenails. Get help right away if:  You have a wound, scrape, corn, or callus on your foot and: ? You have pain, swelling, or redness that gets worse. ? You have fluid or blood coming from the wound, scrape, corn, or callus. ? Your wound, scrape, corn, or callus feels warm to the touch. ? You have pus or a bad smell coming from  the wound, scrape, corn, or callus. ? You have a fever. ? You have a red line going up your leg. Summary  Check your feet every day for blisters, cuts, bruises, sores, and redness.  Apply a moisturizing lotion or petroleum jelly to the skin on your feet and to dry, brittle toenails.  Wear shoes that fit properly and have enough cushioning.  If you have foot problems, report any cuts, sores, or bruises to your health care provider immediately.  Schedule a complete foot exam at least once a year (annually) or more often if you have foot problems. This information is not intended to replace advice given to you by your health care provider. Make sure you discuss any questions you have with your health care provider. Document Revised: 08/09/2019 Document Reviewed: 08/09/2019 Elsevier Patient Education  2021 Elsevier Inc. Ciclopirox nail solution What is this medicine? CICLOPIROX (sye kloe PEER ox) NAIL SOLUTION is an antifungal medicine. It used to treat fungal infections of the nails. This medicine may be used for other purposes; ask your health care provider or pharmacist if you have questions. COMMON BRAND NAME(S): CNL8, [place]lac What should I tell my health care provider before I take this medicine? They need to know if you have any of these conditions:  diabetes mellitus  history of seizures  HIV infection  immune system problems or organ transplant  large areas of burned or damaged skin  peripheral vascular disease or poor circulation  taking corticosteroid medication (including steroid inhalers, cream, or lotion)  an unusual or allergic reaction to ciclopirox, isopropyl alcohol, other medicines, foods, dyes, or preservatives  pregnant or trying to get pregnant  breast-feeding How should I use this medicine? This medicine is for external use only. Follow the directions that come with this medicine exactly. Wash and dry your hands before use. Avoid contact with the eyes,  mouth or nose. If you do get this medicine in your eyes, rinse out with plenty of cool tap water. Contact your doctor or health care professional if eye irritation occurs. Use at regular intervals. Do not use your medicine more often than directed. Finish the full course prescribed by your doctor or health care professional even if you think you are better. Do not stop using except on your doctor's advice. Talk to your pediatrician regarding the use of this medicine in children. While this medicine may be prescribed for children as young as 12 years for selected conditions, precautions do apply. Overdosage: If you think you have taken too much of this medicine contact a poison control center or emergency room at once. NOTE: This medicine is only for you. Do not share this medicine with others. What if I miss a   miss a dose? If you miss a dose, use it as soon as you can. If it is almost time for your next dose, use only that dose. Do not use double or extra doses. What may interact with this medicine? Interactions are not expected. Do not use any other skin products without telling your doctor or health care professional. This list may not describe all possible interactions. Give your health care provider a list of all the medicines, herbs, non-prescription drugs, or dietary supplements you use. Also tell them if you smoke, drink alcohol, or use illegal drugs. Some items may interact with your medicine. What should I watch for while using this medicine? Tell your doctor or health care professional if your symptoms get worse. Four to six months of treatment may be needed for the nail(s) to improve. Some people may not achieve a complete cure or clearing of the nails by this time. Tell your doctor or health care professional if you develop sores or blisters that do not heal properly. If your nail infection returns after stopping using this product, contact your doctor or health care professional. What side  effects may I notice from receiving this medicine? Side effects that you should report to your doctor or health care professional as soon as possible:  allergic reactions like skin rash, itching or hives, swelling of the face, lips, or tongue  severe irritation, redness, burning, blistering, peeling, swelling, oozing Side effects that usually do not require medical attention (report to your doctor or health care professional if they continue or are bothersome):  mild reddening of the skin  nail discoloration  temporary burning or mild stinging at the site of application This list may not describe all possible side effects. Call your doctor for medical advice about side effects. You may report side effects to FDA at 1-800-FDA-1088. Where should I keep my medicine? Keep out of the reach of children. Store at room temperature between 15 and 30 degrees C (59 and 86 degrees F). Do not freeze. Protect from light by storing the bottle in the carton after every use. This medicine is flammable. Keep away from heat and flame. Throw away any unused medicine after the expiration date. NOTE: This sheet is a summary. It may not cover all possible information. If you have questions about this medicine, talk to your doctor, pharmacist, or health care provider.  2021 Elsevier/Gold Standard (2007-04-24 16:49:20)

## 2020-03-19 NOTE — Progress Notes (Signed)
Subjective:   Patient ID: Katelyn [name], female   DOB: 65 y.o.   MRN: [id number]   HPI 65 year old female presents the office today for diabetic foot exam as well as to have the nails trimmed as they are thick and elongated she cannot trim them herself.  Denies any open lesions.  She is diabetic and last A1c was 7.1.  Denies any claudication symptoms.  She has no other concerns today.   Review of Systems  All other systems reviewed and are negative.  Past Medical History:  Diagnosis Date  . Borderline diabetes   . Heart murmur   . Hypertension   . Obesity   . Osteoarthritis   . Positive PPD, treated    INH x 9 months   . Seasonal allergies   . Sleep apnea    no CPAP    Past Surgical History:  Procedure Laterality Date  . ABDOMINAL HYSTERECTOMY  1999   Uterus Removed (Fibroids)-partial  . CARDIAC CATHETERIZATION     12/14/11 good LV systolic function, mild LVH, LAD has 10-15% proximal and mid stenosis.   . COLONOSCOPY    . KNEE ARTHROSCOPY     bilateral  . SHOULDER ARTHROSCOPY     right  . TOTAL KNEE REVISION  03/06/2012   Procedure: TOTAL KNEE REVISION;  Surgeon: Dannielle Huh, MD;  Location: [facility] OR;  Service: Orthopedics;  Laterality: Right;     Current Outpatient Medications:  .  ciclopirox (PENLAC) 8 % solution, Apply topically at bedtime. Apply over nail and surrounding skin. Apply daily over previous coat. After seven (7) days, may remove with alcohol and continue cycle., Disp: 6.6 mL, Rfl: 2 .  ACAI BERRY PO, Take by mouth daily., Disp: , Rfl:  .  allopurinol (ZYLOPRIM) 300 MG tablet, Take 300 mg by mouth daily., Disp: , Rfl:  .  aspirin 81 MG chewable tablet, Chew 81 mg by mouth every morning., Disp: , Rfl:  .  cholecalciferol (VITAMIN D) 1000 UNITS tablet, Take 1,000 Units by mouth daily., Disp: , Rfl:  .  loratadine (CLARITIN) 10 MG tablet, Take 10 mg by mouth daily., Disp: , Rfl:  .  metFORMIN (GLUCOPHAGE) 1000 MG tablet, Take 1,000 mg by mouth 2 (two) times  daily., Disp: , Rfl:  .  Metoprolol-Hydrochlorothiazide (DUTOPROL) 100-12.5 MG TB24, Take 1 tablet by mouth daily. (Patient not taking: Reported on 08/06/2014), Disp: 90 tablet, Rfl: 3 .  Multiple Vitamin (MULTIVITAMIN WITH MINERALS) TABS, Take 1 tablet by mouth daily., Disp: , Rfl:  .  pravastatin (PRAVACHOL) 40 MG tablet, Take 40 mg by mouth daily., Disp: , Rfl:   Allergies  Allergen Reactions  . Ibuprofen Rash  . Naproxen Rash  . Penicillins Rash    Childhood allergy          Objective:  Physical Exam  General: AAO x3, NAD  Dermatological: Nails are hypertrophic, dystrophic, brittle, discolored, elongated 10. No surrounding redness or drainage. Tenderness nails 1-5 bilaterally. No open lesions or pre-ulcerative lesions are identified today.  Vascular: Dorsalis Pedis artery and Posterior Tibial artery pedal pulses are palpable bilateral with immedate capillary fill time. There is no pain with calf compression, swelling, warmth, erythema.   Neruologic: Grossly intact via light touch bilateral.  Sensation intact with Semmes Weinstein monofilament.  Musculoskeletal: No gross boney pedal deformities bilateral. No pain, crepitus, or limitation noted with foot and ankle range of motion bilateral. Muscular strength 5/5 in all groups tested bilateral.     Assessment:   65 year old  female presents for diabetic foot evaluation, symptomatic onychomycosis     Plan:  -Treatment options discussed including all alternatives, risks, and complications -Etiology of symptoms were discussed -Nails debrided 10 without complications or bleeding. -Daily foot inspection -Follow-up in 3 months or sooner if any problems arise. In the meantime, encouraged to call the office with any questions, concerns, change in symptoms.   Ovid Curd, DPM

## 2020-07-30 ENCOUNTER — Other Ambulatory Visit: Payer: Self-pay | Admitting: Family Medicine

## 2020-07-30 DIAGNOSIS — E2839 Other primary ovarian failure: Secondary | ICD-10-CM

## 2020-08-07 ENCOUNTER — Ambulatory Visit
Admission: RE | Admit: 2020-08-07 | Discharge: 2020-08-07 | Disposition: A | Discharge status: Home / Self Care | Payer: 59 | Source: Ambulatory Visit | Attending: Family Medicine | Admitting: Family Medicine

## 2020-08-07 ENCOUNTER — Other Ambulatory Visit: Payer: Self-pay

## 2020-08-07 DIAGNOSIS — E2839 Other primary ovarian failure: Secondary | ICD-10-CM

## 2020-09-15 ENCOUNTER — Ambulatory Visit: Payer: 59 | Admitting: Podiatry

## 2020-11-10 ENCOUNTER — Ambulatory Visit: Payer: 59 | Admitting: Podiatry

## 2020-12-09 ENCOUNTER — Ambulatory Visit: Payer: 59 | Admitting: Podiatry

## 2021-02-11 ENCOUNTER — Ambulatory Visit: Payer: 59 | Admitting: Podiatry

## 2021-02-13 ENCOUNTER — Other Ambulatory Visit: Payer: Self-pay

## 2021-02-13 ENCOUNTER — Ambulatory Visit (INDEPENDENT_AMBULATORY_CARE_PROVIDER_SITE_OTHER): Payer: 59

## 2021-02-13 ENCOUNTER — Ambulatory Visit (INDEPENDENT_AMBULATORY_CARE_PROVIDER_SITE_OTHER): Payer: 59 | Admitting: Podiatry

## 2021-02-13 DIAGNOSIS — M79672 Pain in left foot: Secondary | ICD-10-CM

## 2021-02-13 DIAGNOSIS — Q666 Other congenital valgus deformities of feet: Secondary | ICD-10-CM

## 2021-02-18 NOTE — Progress Notes (Signed)
Subjective:  Patient ID: Katelyn [name], female    DOB: 06-16-1955,  MRN: [id number]  No chief complaint on file.   66 y.o. female presents with the above complaint.  Patient presents with complaint of mild heel and arch pain.  Patient states that this has been going on for a little while has progressed gotten worse.  She does have history of Planter fasciitis.  She wanted to get it evaluated.  She does not want to discuss or do any kind of injections or treatment options she is wondering if she can make any changes to her shoes to prevent this from getting worse.  She denies any other acute issues.  She had some swelling which has now since resolved.   Review of Systems: Negative except as noted in the HPI. Denies N/V/F/Ch.  Past Medical History:  Diagnosis Date   Borderline diabetes    Heart murmur    Hypertension    Obesity    Osteoarthritis    Positive PPD, treated    INH x 9 months    Seasonal allergies    Sleep apnea    no CPAP    Current Outpatient Medications:    ACAI BERRY PO, Take by mouth daily., Disp: , Rfl:    allopurinol (ZYLOPRIM) 300 MG tablet, Take 300 mg by mouth daily., Disp: , Rfl:    aspirin 81 MG chewable tablet, Chew 81 mg by mouth every morning., Disp: , Rfl:    cholecalciferol (VITAMIN D) 1000 UNITS tablet, Take 1,000 Units by mouth daily., Disp: , Rfl:    ciclopirox (PENLAC) 8 % solution, Apply topically at bedtime. Apply over nail and surrounding skin. Apply daily over previous coat. After seven (7) days, may remove with alcohol and continue cycle., Disp: 6.6 mL, Rfl: 2   loratadine (CLARITIN) 10 MG tablet, Take 10 mg by mouth daily., Disp: , Rfl:    metFORMIN (GLUCOPHAGE) 1000 MG tablet, Take 1,000 mg by mouth 2 (two) times daily., Disp: , Rfl:    Metoprolol-Hydrochlorothiazide (DUTOPROL) 100-12.5 MG TB24, Take 1 tablet by mouth daily. (Patient not taking: Reported on 08/06/2014), Disp: 90 tablet, Rfl: 3   Multiple Vitamin (MULTIVITAMIN WITH MINERALS)  TABS, Take 1 tablet by mouth daily., Disp: , Rfl:    pravastatin (PRAVACHOL) 40 MG tablet, Take 40 mg by mouth daily., Disp: , Rfl:   Social History   Tobacco Use  Smoking Status Never  Smokeless Tobacco Never    Allergies  Allergen Reactions   Ibuprofen Rash   Naproxen Rash   Penicillins Rash    Childhood allergy    Objective:  There were no vitals filed for this visit. There is no height or weight on file to calculate BMI. Constitutional Well developed. Well nourished.  Vascular Dorsalis pedis pulses palpable bilaterally. Posterior tibial pulses palpable bilaterally. Capillary refill normal to all digits.  No cyanosis or clubbing noted. Pedal hair growth normal.  Neurologic Normal speech. Oriented to person, place, and time. Epicritic sensation to light touch grossly present bilaterally.  Dermatologic Nails well groomed and normal in appearance. No open wounds. No skin lesions.  Orthopedic: Very mild pain at the heel and the arch bilaterally.  Pain at the calcaneal tuber mild.  Gait examination shows pes planovalgus foot structure with calcaneovalgus to many toe signs unable to recruit the arch with dorsiflexion of the hallux.   Radiographs: 3 views of skeletally mature adult left foot:Plantar and posterior heel spurring noted.  Pes planovalgus foot structure noted.  Bilateral  valgus of the first ray noted. Assessment:   1. Left foot pain    Plan:  Patient was evaluated and treated and all questions answered.  Pes planovalgus -All questions and concerns were discussed with the patient in extensive detail -I ultimately discussed with her she will benefit from orthotics however patient would like to hold off on custom orthotics.  I discussed custom orthotics including power steps. -Also discussed shoe gear modification.  No follow-ups on file.

## 2021-10-09 ENCOUNTER — Ambulatory Visit: Payer: 59 | Admitting: Podiatry

## 2021-11-04 ENCOUNTER — Ambulatory Visit (INDEPENDENT_AMBULATORY_CARE_PROVIDER_SITE_OTHER): Payer: 59 | Admitting: Podiatry

## 2021-11-04 DIAGNOSIS — E1121 Type 2 diabetes mellitus with diabetic nephropathy: Secondary | ICD-10-CM

## 2021-11-04 DIAGNOSIS — E119 Type 2 diabetes mellitus without complications: Secondary | ICD-10-CM | POA: Diagnosis not present

## 2021-11-04 NOTE — Progress Notes (Signed)
Subjective:   Patient ID: Katelyn [name], female   DOB: 66 y.o.   MRN: [id number]   Diabetes   66 year old female presents the office today for diabetic foot exam as well as to have the nails trimmed as they are thick and elongated she cannot trim them herself.  Denies any open lesions.  She is diabetic and last A1c was 6.1.  Denies any claudication symptoms.  She has no other concerns today.   Review of Systems  All other systems reviewed and are negative.  Past Medical History:  Diagnosis Date   Borderline diabetes    Heart murmur    Hypertension    Obesity    Osteoarthritis    Positive PPD, treated    INH x 9 months    Seasonal allergies    Sleep apnea    no CPAP    Past Surgical History:  Procedure Laterality Date   ABDOMINAL HYSTERECTOMY  1999   Uterus Removed (Fibroids)-partial   CARDIAC CATHETERIZATION     75/64/33 good LV systolic function, mild LVH, LAD has 10-15% proximal and mid stenosis.    COLONOSCOPY     KNEE ARTHROSCOPY     bilateral   SHOULDER ARTHROSCOPY     right   TOTAL KNEE REVISION  03/06/2012   Procedure: TOTAL KNEE REVISION;  Surgeon: Vickey Huger, MD;  Location: [facility];  Service: Orthopedics;  Laterality: Right;     Current Outpatient Medications:    ACAI BERRY PO, Take by mouth daily., Disp: , Rfl:    allopurinol (ZYLOPRIM) 300 MG tablet, Take 300 mg by mouth daily., Disp: , Rfl:    aspirin 81 MG chewable tablet, Chew 81 mg by mouth every morning., Disp: , Rfl:    cholecalciferol (VITAMIN D) 1000 UNITS tablet, Take 1,000 Units by mouth daily., Disp: , Rfl:    ciclopirox (PENLAC) 8 % solution, Apply topically at bedtime. Apply over nail and surrounding skin. Apply daily over previous coat. After seven (7) days, may remove with alcohol and continue cycle., Disp: 6.6 mL, Rfl: 2   loratadine (CLARITIN) 10 MG tablet, Take 10 mg by mouth daily., Disp: , Rfl:    metFORMIN (GLUCOPHAGE) 1000 MG tablet, Take 1,000 mg by mouth 2 (two) times daily., Disp:  , Rfl:    Metoprolol-Hydrochlorothiazide (DUTOPROL) 100-12.5 MG TB24, Take 1 tablet by mouth daily. (Patient not taking: Reported on 08/06/2014), Disp: 90 tablet, Rfl: 3   Multiple Vitamin (MULTIVITAMIN WITH MINERALS) TABS, Take 1 tablet by mouth daily., Disp: , Rfl:    pravastatin (PRAVACHOL) 40 MG tablet, Take 40 mg by mouth daily., Disp: , Rfl:   Allergies  Allergen Reactions   Ibuprofen Rash   Naproxen Rash   Penicillins Rash    Childhood allergy          Objective:  Physical Exam  General: AAO x3, NAD  Dermatological: Nails are hypertrophic, dystrophic, brittle, discolored, elongated 10. No surrounding redness or drainage. Tenderness nails 1-5 bilaterally. No open lesions or pre-ulcerative lesions are identified today.  Vascular: Dorsalis Pedis artery and Posterior Tibial artery pedal pulses are palpable bilateral with immedate capillary fill time. There is no pain with calf compression, swelling, warmth, erythema.   Neruologic: Grossly intact via light touch bilateral.  Sensation intact with Semmes Weinstein monofilament.  Musculoskeletal: No gross boney pedal deformities bilateral. No pain, crepitus, or limitation noted with foot and ankle range of motion bilateral. Muscular strength 5/5 in all groups tested bilateral.     Assessment:  66 year old female presents for diabetic foot evaluation, symptomatic onychomycosis     Plan:  -Treatment options discussed including all alternatives, risks, and complications -Etiology of symptoms were discussed -Nails debrided 10 without complications or bleeding. -Daily foot inspection -Follow-up in 3 months or sooner if any problems arise. In the meantime, encouraged to call the office with any questions, concerns, change in symptoms.   Nicholes Rough D.P.M.

## 2022-11-03 ENCOUNTER — Ambulatory Visit: Payer: 59 | Admitting: Podiatry

## 2022-12-08 ENCOUNTER — Ambulatory Visit: Payer: 59 | Admitting: Podiatry

## 2023-01-12 ENCOUNTER — Ambulatory Visit: Payer: 59 | Admitting: Podiatry

## 2023-04-19 ENCOUNTER — Ambulatory Visit (INDEPENDENT_AMBULATORY_CARE_PROVIDER_SITE_OTHER): Admitting: Podiatry

## 2023-04-19 ENCOUNTER — Other Ambulatory Visit: Payer: Self-pay | Admitting: Podiatry

## 2023-04-19 ENCOUNTER — Ambulatory Visit (INDEPENDENT_AMBULATORY_CARE_PROVIDER_SITE_OTHER)

## 2023-04-19 DIAGNOSIS — M216X2 Other acquired deformities of left foot: Secondary | ICD-10-CM

## 2023-04-19 DIAGNOSIS — Z01818 Encounter for other preprocedural examination: Secondary | ICD-10-CM

## 2023-04-19 DIAGNOSIS — M79672 Pain in left foot: Secondary | ICD-10-CM

## 2023-04-19 NOTE — Progress Notes (Signed)
 Subjective:  Patient ID: Katelyn [name], female    DOB: [birth year],  MRN: [id number]  Chief Complaint  Patient presents with   Diabetes    Foot exam  Nail/callus trim     68 y.o. female presents with the above complaint.  Patient presents with left submetatarsal 2 plantarflexed metatarsal painful to touch.  Hurts with ambulation or shoe pressure patient states this callus has been continues bothersome she would like to discuss surgery options at this time she is a diabetic with last A1c of 6.1.  She is tried multiple callus debridement as well as offloading shoe gear modification none of which has helped.  She would like to discuss surgical options   Review of Systems: Negative except as noted in the HPI. Denies N/V/F/Ch.  Past Medical History:  Diagnosis Date   Borderline diabetes    Heart murmur    Hypertension    Obesity    Osteoarthritis    Positive PPD, treated    INH x 9 months    Seasonal allergies    Sleep apnea    no CPAP    Current Outpatient Medications:    ACAI BERRY PO, Take by mouth daily., Disp: , Rfl:    allopurinol (ZYLOPRIM) 300 MG tablet, Take 300 mg by mouth daily. (Patient not taking: Reported on 04/21/2023), Disp: , Rfl:    aspirin 81 MG chewable tablet, Chew 81 mg by mouth every morning., Disp: , Rfl:    atorvastatin (LIPITOR) 20 MG tablet, Take 20 mg by mouth daily., Disp: , Rfl:    cholecalciferol (VITAMIN D) 1000 UNITS tablet, Take 1,000 Units by mouth daily., Disp: , Rfl:    empagliflozin (JARDIANCE) 25 MG TABS tablet, Take 25 mg by mouth daily., Disp: , Rfl:    hydrALAZINE (APRESOLINE) 100 MG tablet, Take 1 tablet (100 mg total) by mouth every 8 (eight) hours., Disp: 90 tablet, Rfl: 0   metFORMIN (GLUCOPHAGE) 1000 MG tablet, Take 1,000 mg by mouth 2 (two) times daily., Disp: , Rfl:    metoprolol succinate (TOPROL-XL) 50 MG 24 hr tablet, Take 1 tablet (50 mg total) by mouth daily., Disp: 30 tablet, Rfl: 0   Multiple Vitamin (MULTIVITAMIN WITH  MINERALS) TABS, Take 1 tablet by mouth daily., Disp: , Rfl:    pravastatin (PRAVACHOL) 40 MG tablet, Take 40 mg by mouth daily., Disp: , Rfl:    sitaGLIPtin (JANUVIA) 100 MG tablet, Take 100 mg by mouth daily., Disp: , Rfl:   Social History   Tobacco Use  Smoking Status Never  Smokeless Tobacco Never    Allergies  Allergen Reactions   Ibuprofen Rash   Naproxen Rash   Penicillins Rash    Childhood allergy    Objective:  There were no vitals filed for this visit. There is no height or weight on file to calculate BMI. Constitutional Well developed. Well nourished.  Vascular Dorsalis pedis pulses palpable bilaterally. Posterior tibial pulses palpable bilaterally. Capillary refill normal to all digits.  No cyanosis or clubbing noted. Pedal hair growth normal.  Neurologic Normal speech. Oriented to person, place, and time. Epicritic sensation to light touch grossly present bilaterally.  Dermatologic Nails well groomed and normal in appearance. No open wounds. No skin lesions.  Orthopedic: Left plantarflexed second metatarsal noted with pain on palpation and submetatarsal 2 lesion.  No abnormality noted   Radiographs: 3 views of skeletally mature adult leftFoot: Plantarflexed second metatarsal noted.  Midfoot arthritis noted.  Plantar and posterior heel spurring noted.  Hammertoe contractures  noted no other abnormalities identified Assessment:   1. Left foot pain   2. Encounter for preoperative examination for general surgical procedure   3. Plantar flexed metatarsal bone of left foot    Plan:  Patient was evaluated and treated and all questions answered.  Left second metatarsal plantarflexed - all questions and concerns were discussed with the patient in extensive detail -Given the patient has not no relief of colon cancer.  Patient will benefit from floating osteotomy of the left second metatarsal discussed my preoperative course or family patient extensive detail she  states understand like to proceed with surgery -Informed surgical risk consent was reviewed and read aloud to the patient.  I reviewed the films.  I have discussed my findings with the patient in great detail.  I have discussed all risks including but not limited to infection, stiffness, scarring, limp, disability, deformity, damage to blood vessels and nerves, numbness, poor healing, need for braces, arthritis, chronic pain, amputation, death.  All benefits and realistic expectations discussed in great detail.  I have made no promises as to the outcome.  I have provided realistic expectations.  I have offered the patient a 2nd opinion, which they have declined and assured me they preferred to proceed despite the risks   No follow-ups on file.

## 2023-04-20 ENCOUNTER — Telehealth: Payer: Self-pay | Admitting: Urology

## 2023-04-20 NOTE — Telephone Encounter (Signed)
 LM for pt to call back to schedule sx with Dr. Allena Katz.

## 2023-04-21 ENCOUNTER — Observation Stay (HOSPITAL_COMMUNITY)
Admission: EM | Admit: 2023-04-21 | Discharge: 2023-04-22 | Disposition: A | Discharge status: Home / Self Care | Attending: Internal Medicine | Admitting: Internal Medicine

## 2023-04-21 ENCOUNTER — Emergency Department (HOSPITAL_COMMUNITY)

## 2023-04-21 ENCOUNTER — Encounter (HOSPITAL_COMMUNITY): Payer: Self-pay

## 2023-04-21 ENCOUNTER — Other Ambulatory Visit: Payer: Self-pay

## 2023-04-21 DIAGNOSIS — I10 Essential (primary) hypertension: Secondary | ICD-10-CM | POA: Diagnosis not present

## 2023-04-21 DIAGNOSIS — Z79899 Other long term (current) drug therapy: Secondary | ICD-10-CM | POA: Insufficient documentation

## 2023-04-21 DIAGNOSIS — Z96651 Presence of right artificial knee joint: Secondary | ICD-10-CM | POA: Diagnosis not present

## 2023-04-21 DIAGNOSIS — I471 Supraventricular tachycardia, unspecified: Principal | ICD-10-CM | POA: Insufficient documentation

## 2023-04-21 DIAGNOSIS — G4733 Obstructive sleep apnea (adult) (pediatric): Secondary | ICD-10-CM | POA: Insufficient documentation

## 2023-04-21 DIAGNOSIS — R002 Palpitations: Secondary | ICD-10-CM | POA: Diagnosis present

## 2023-04-21 DIAGNOSIS — Z7982 Long term (current) use of aspirin: Secondary | ICD-10-CM | POA: Insufficient documentation

## 2023-04-21 DIAGNOSIS — E119 Type 2 diabetes mellitus without complications: Secondary | ICD-10-CM | POA: Diagnosis not present

## 2023-04-21 DIAGNOSIS — Z7984 Long term (current) use of oral hypoglycemic drugs: Secondary | ICD-10-CM | POA: Diagnosis not present

## 2023-04-21 DIAGNOSIS — N179 Acute kidney failure, unspecified: Secondary | ICD-10-CM | POA: Insufficient documentation

## 2023-04-21 DIAGNOSIS — E785 Hyperlipidemia, unspecified: Secondary | ICD-10-CM | POA: Insufficient documentation

## 2023-04-21 LAB — COMPREHENSIVE METABOLIC PANEL
ALT: 18 U/L (ref 0–44)
AST: 19 U/L (ref 15–41)
Albumin: 3.1 g/dL — ABNORMAL LOW (ref 3.5–5.0)
Alkaline Phosphatase: 64 U/L (ref 38–126)
Anion gap: 8 (ref 5–15)
BUN: 29 mg/dL — ABNORMAL HIGH (ref 8–23)
CO2: 24 mmol/L (ref 22–32)
Calcium: 8.9 mg/dL (ref 8.9–10.3)
Chloride: 105 mmol/L (ref 98–111)
Creatinine, Ser: 1.29 mg/dL — ABNORMAL HIGH (ref 0.44–1.00)
GFR, Estimated: 45 mL/min — ABNORMAL LOW (ref >60)
Glucose, Bld: 136 mg/dL — ABNORMAL HIGH (ref 70–99)
Potassium: 3.8 mmol/L (ref 3.5–5.1)
Sodium: 137 mmol/L (ref 135–145)
Total Bilirubin: 0.7 mg/dL (ref 0.0–1.2)
Total Protein: 6.5 g/dL (ref 6.5–8.1)

## 2023-04-21 LAB — CBC WITH DIFFERENTIAL/PLATELET
Abs Immature Granulocytes: 0.02 10*3/uL (ref 0.00–0.07)
Basophils Absolute: 0 10*3/uL (ref 0.0–0.1)
Basophils Relative: 1 %
Eosinophils Absolute: 0.1 10*3/uL (ref 0.0–0.5)
Eosinophils Relative: 2 %
HCT: 38.2 % (ref 36.0–46.0)
Hemoglobin: 12 g/dL (ref 12.0–15.0)
Immature Granulocytes: 0 %
Lymphocytes Relative: 32 %
Lymphs Abs: 2.2 10*3/uL (ref 0.7–4.0)
MCH: 24.5 pg — ABNORMAL LOW (ref 26.0–34.0)
MCHC: 31.4 g/dL (ref 30.0–36.0)
MCV: 78 fL — ABNORMAL LOW (ref 80.0–100.0)
Monocytes Absolute: 0.7 10*3/uL (ref 0.1–1.0)
Monocytes Relative: 10 %
Neutro Abs: 3.8 10*3/uL (ref 1.7–7.7)
Neutrophils Relative %: 55 %
Platelets: 231 10*3/uL (ref 150–400)
RBC: 4.9 MIL/uL (ref 3.87–5.11)
RDW: 16.6 % — ABNORMAL HIGH (ref 11.5–15.5)
WBC: 6.8 10*3/uL (ref 4.0–10.5)
nRBC: 0 % (ref 0.0–0.2)

## 2023-04-21 LAB — CREATININE, SERUM
Creatinine, Ser: 1.23 mg/dL — ABNORMAL HIGH (ref 0.44–1.00)
GFR, Estimated: 48 mL/min — ABNORMAL LOW (ref >60)

## 2023-04-21 LAB — CBC
HCT: 35.4 % — ABNORMAL LOW (ref 36.0–46.0)
Hemoglobin: 11.6 g/dL — ABNORMAL LOW (ref 12.0–15.0)
MCH: 25.3 pg — ABNORMAL LOW (ref 26.0–34.0)
MCHC: 32.8 g/dL (ref 30.0–36.0)
MCV: 77.3 fL — ABNORMAL LOW (ref 80.0–100.0)
Platelets: 251 10*3/uL (ref 150–400)
RBC: 4.58 MIL/uL (ref 3.87–5.11)
RDW: 16.7 % — ABNORMAL HIGH (ref 11.5–15.5)
WBC: 6.6 10*3/uL (ref 4.0–10.5)
nRBC: 0 % (ref 0.0–0.2)

## 2023-04-21 LAB — TROPONIN I (HIGH SENSITIVITY)
Troponin I (High Sensitivity): 5 ng/L (ref <18)
Troponin I (High Sensitivity): 11 ng/L (ref <18)

## 2023-04-21 LAB — MAGNESIUM: Magnesium: 2.2 mg/dL (ref 1.7–2.4)

## 2023-04-21 MED ORDER — ASPIRIN 81 MG PO CHEW
81.0000 mg | CHEWABLE_TABLET | ORAL | Status: DC
  Administered 2023-04-22: 81 mg via ORAL
  Filled 2023-04-21 (×2): qty 1

## 2023-04-21 MED ORDER — METOPROLOL SUCCINATE ER 25 MG PO TB24: 25.0000 mg | ORAL_TABLET | Freq: Every day | ORAL | Status: DC

## 2023-04-21 MED ORDER — ACETAMINOPHEN 325 MG PO TABS
650.0000 mg | ORAL_TABLET | Freq: Four times a day (QID) | ORAL | Status: DC | PRN
  Administered 2023-04-22: 650 mg via ORAL
  Filled 2023-04-21: qty 2

## 2023-04-21 MED ORDER — INSULIN ASPART 100 UNIT/ML IJ SOLN: 0.0000 [IU] | Freq: Three times a day (TID) | INTRAMUSCULAR | Status: DC

## 2023-04-21 MED ORDER — SENNOSIDES-DOCUSATE SODIUM 8.6-50 MG PO TABS: 1.0000 | ORAL_TABLET | Freq: Every evening | ORAL | Status: DC | PRN

## 2023-04-21 MED ORDER — ONDANSETRON HCL 4 MG PO TABS: 4.0000 mg | ORAL_TABLET | Freq: Four times a day (QID) | ORAL | Status: DC | PRN

## 2023-04-21 MED ORDER — IRBESARTAN 300 MG PO TABS: 150.0000 mg | ORAL_TABLET | Freq: Every day | ORAL | Status: DC

## 2023-04-21 MED ORDER — ATORVASTATIN CALCIUM 40 MG PO TABS
20.0000 mg | ORAL_TABLET | Freq: Every day | ORAL | Status: DC
  Administered 2023-04-22: 20 mg via ORAL
  Filled 2023-04-21: qty 1

## 2023-04-21 MED ORDER — SODIUM CHLORIDE 0.9 % IV BOLUS
1000.0000 mL | Freq: Once | INTRAVENOUS | Status: AC
  Administered 2023-04-21: 1000 mL via INTRAVENOUS

## 2023-04-21 MED ORDER — ONDANSETRON HCL 4 MG/2ML IJ SOLN: 4.0000 mg | Freq: Four times a day (QID) | INTRAMUSCULAR | Status: DC | PRN

## 2023-04-21 MED ORDER — LOSARTAN POTASSIUM 50 MG PO TABS
50.0000 mg | ORAL_TABLET | Freq: Once | ORAL | Status: AC
  Administered 2023-04-21: 50 mg via ORAL
  Filled 2023-04-21: qty 1

## 2023-04-21 MED ORDER — INSULIN ASPART 100 UNIT/ML IJ SOLN: 0.0000 [IU] | Freq: Every day | INTRAMUSCULAR | Status: DC

## 2023-04-21 MED ORDER — METOPROLOL TARTRATE 25 MG PO TABS
25.0000 mg | ORAL_TABLET | Freq: Once | ORAL | Status: AC
  Administered 2023-04-21: 25 mg via ORAL
  Filled 2023-04-21: qty 1

## 2023-04-21 MED ORDER — ENOXAPARIN SODIUM 40 MG/0.4ML IJ SOSY
40.0000 mg | PREFILLED_SYRINGE | INTRAMUSCULAR | Status: DC
  Administered 2023-04-22: 40 mg via SUBCUTANEOUS
  Filled 2023-04-21: qty 0.4

## 2023-04-21 MED ORDER — ACETAMINOPHEN 650 MG RE SUPP: 650.0000 mg | Freq: Four times a day (QID) | RECTAL | Status: DC | PRN

## 2023-04-21 MED ORDER — HYDRALAZINE HCL 20 MG/ML IJ SOLN
10.0000 mg | Freq: Once | INTRAMUSCULAR | Status: AC
  Administered 2023-04-21: 10 mg via INTRAVENOUS
  Filled 2023-04-21: qty 1

## 2023-04-21 NOTE — H&P (Incomplete)
 PCP:   Pcp, No   Chief Complaint:  Palpitations, SVT  HPI: This is a 67 year old female with past medical history significant for HTN, osteoarthritis, OSA [no CPAP], T2DM, gout, HLD.  Patient has a history of palpitation, has been going on Holter monitor to capture arrhythmia.  Arrhythmias been captured however has been infrequent and patient has been asymptomatic.  Her cardiologist are watching.  A couple of weeks ago the patient noted increasing frequency of her palpitations.  Today she was in her cardiologist office Dr. Sharyn Lull for an appointment when she developed palpitations, back to back, self resolving.  EKG done showed to have SVT.  Patient has recently become symptomatic, she is a lightheaded.  She denies chest pains.  She did denies being presyncopal.  Patient did not take her meds today.  Dr. Sharyn Lull increased her metoprolol to 50 mg p.o. daily and directed her to the ER.  In the ER presenting vitals 208/113, 91.  Creatinine 1.29.  EKG NSR.  Admission requested for symptomatic SVT  Review of Systems:  Per HPI  Past Medical History: Past Medical History:  Diagnosis Date   Borderline diabetes    Heart murmur    Hypertension    Obesity    Osteoarthritis    Positive PPD, treated    INH x 9 months    Seasonal allergies    Sleep apnea    no CPAP   Past Surgical History:  Procedure Laterality Date   ABDOMINAL HYSTERECTOMY  1999   Uterus Removed (Fibroids)-partial   CARDIAC CATHETERIZATION     12/14/11 good LV systolic function, mild LVH, LAD has 10-15% proximal and mid stenosis.    COLONOSCOPY     KNEE ARTHROSCOPY     bilateral   SHOULDER ARTHROSCOPY     right   TOTAL KNEE REVISION  03/06/2012   Procedure: TOTAL KNEE REVISION;  Surgeon: Dannielle Huh, MD;  Location: [facility];  Service: Orthopedics;  Laterality: Right;    Medications: Prior to Admission medications   Medication Sig Start Date End Date Taking? Authorizing Provider  ACAI BERRY PO Take by mouth daily.     [provider]  allopurinol (ZYLOPRIM) 300 MG tablet Take 300 mg by mouth daily. 08/01/14 08/01/15  [provider]  aspirin 81 MG chewable tablet Chew 81 mg by mouth every morning.    [provider]  cholecalciferol (VITAMIN D) 1000 UNITS tablet Take 1,000 Units by mouth daily.    [provider]  ciclopirox (PENLAC) 8 % solution Apply topically at bedtime. Apply over nail and surrounding skin. Apply daily over previous coat. After seven (7) days, may remove with alcohol and continue cycle. 03/17/20   Vivi Barrack, DPM  loratadine (CLARITIN) 10 MG tablet Take 10 mg by mouth daily.    [provider]  metFORMIN (GLUCOPHAGE) 1000 MG tablet Take 1,000 mg by mouth 2 (two) times daily. 08/01/14 08/01/15  [provider]  Metoprolol-Hydrochlorothiazide (DUTOPROL) 100-12.5 MG TB24 Take 1 tablet by mouth daily. Patient not taking: Reported on 08/06/2014 07/25/13 07/26/14  Gillian Scarce, MD  Multiple Vitamin (MULTIVITAMIN WITH MINERALS) TABS Take 1 tablet by mouth daily.    [provider]  pravastatin (PRAVACHOL) 40 MG tablet Take 40 mg by mouth daily. 08/01/14 08/01/15  [provider]    Allergies:   Allergies  Allergen Reactions   Ibuprofen Rash   Naproxen Rash   Penicillins Rash    Childhood allergy     Social History:  reports that she has never smoked. She has never used smokeless tobacco. She reports current alcohol use. She reports that she does not use drugs.  Family History: Family History  Problem Relation Age of Onset   Cancer Mother 63       Lung    Hypertension Mother    Kidney disease Brother        On Dialysis    Hypertension Brother    Kidney disease Brother        S/P Kidney Transplant   Diabetes Brother    Hypertension Brother    Hypertension Sister    Cancer Sister        breast   Breast cancer Sister    Hypertension Sister    HIV/AIDS Sister     Physical Exam: Vitals:   04/21/23 2005  04/21/23 2010 04/21/23 2015 04/21/23 2030  BP:  (!) 212/98 (!) 201/103 (!) 184/68  Pulse: 61 76 72 60  Resp: 18  (!) 22 17  Temp:      TempSrc:      SpO2: 98%  98% 99%  Weight:      Height:        General:  Alert & oriented x3, well developed and nourished, in no distress Eyes: Pink conjunctiva, no scleral icterus ENT: Moist oral mucosa Lungs: CTA B/L, no use of accessory muscles Cardiovascular: RRR, no murmurs. No carotid bruits, no JVD Abdomen: soft, positive BS, NTND not an acute abdomen GU: not examined Neuro: CN II - XII grossly intact Musculoskeletal: Moves all extremities equally Skin: no rash, no decubitus Psych: appropriate patient   Labs on Admission:  Recent Labs    04/21/23 1700  NA 137  K 3.8  CL 105  CO2 24  GLUCOSE 136*  BUN 29*  CREATININE 1.29*  CALCIUM 8.9  MG 2.2   Recent Labs    04/21/23 1700  AST 19  ALT 18  ALKPHOS 64  BILITOT 0.7  PROT 6.5  ALBUMIN 3.1*    Recent Labs    04/21/23 1700  WBC 6.8  NEUTROABS 3.8  HGB 12.0  HCT 38.2  MCV 78.0*  PLT 231    Radiological Exams on Admission: DG Chest Portable 1 View Result Date: 04/21/2023 CLINICAL DATA:  Chest pain. Increasing SVT and palpitations over the last 2 months. EXAM: PORTABLE CHEST 1 VIEW COMPARISON:  Radiographs 12/11/2013 and 03/08/2012. FINDINGS: 1656 hours. Lordotic positioning. The heart size and mediastinal contours are normal. The lungs are clear. There is no pleural effusion or pneumothorax. No acute osseous findings are identified. Telemetry leads overlie the chest. IMPRESSION: No evidence of acute cardiopulmonary process. Electronically Signed   By: Carey Bullocks M.D.   On: 04/21/2023 17:30    Assessment/Plan Present on Admission:  SVT (supraventricular tachycardia) (HCC) -Currently resolved.  Metoprolol started 50 mg daily, starting a.m.  Patient received 25 mg in ER.  Patient's HR range 60-69 -Cardiology Dr. Sharyn Lull aware.  To see patient in a.m. -2D echo  ordered -We will keep magnesium level close 2, potassium close to 4 -As needed IV Lopressor SVT   Essential hypertension, benign -Uncontrolled.  Patient did not take her home medications today -Metoprolol resumed at this increased dose to 50 mg p.o. daily.  Hydralazine 50 mg p.o. 3 times daily, first dose now [substituted for valsartan] -10 g IV hydralazine -As needed hydralazine ordered   AKI -IV fluid hydration -Hold ARB.  Avoid nephrotoxic medication   OSA (obstructive sleep apnea) -Does not use  CPAP   HLD -Atorvastatin ordered   T2DM -Sliding scale insulin resumed  Daren Doswell 04/21/2023, 9:51 PM

## 2023-04-21 NOTE — ED Notes (Signed)
 CCMD called and verified patient on cardiac telemetry.

## 2023-04-21 NOTE — ED Triage Notes (Signed)
 Per EMS  SVT Sent from cardiologist office Over past 2 month increase SVT Palpitation  10-15 episodes today (self reported) Highest 180 Self converting no intervention needed

## 2023-04-21 NOTE — ED Provider Notes (Signed)
 [place] EMERGENCY DEPARTMENT AT [facility] Provider Note   CSN: [id number] Arrival date & time: 04/21/23  1514     History  Chief Complaint  Patient presents with   Tachycardia    SVT    Katelyn [name] is a 68 y.o. female.  With a history of of hypertension and CKD 3 who presents to the ED for palpitations.  Patient has experienced episodic palpitations with associated lightheadedness for a period of years but these have becoming more frequent recently.  In the last couple of weeks she has had episodes of palpitations with associated lightheadedness which resolved after she lays down for several minutes.  She was seen by her cardiologist earlier today (Dr.Harwani) in the office where ECG revealed transient episode of SVT.  Considering that she was symptomatic during these episodes she was sent here for further evaluation.  At present time she has no chest pain shortness of breath palpitations.  She notes she did not take her medications earlier this morning including valsartan and metoprolol.  No other complaints at this time.  HPI     Home Medications Prior to Admission medications   Medication Sig Start Date End Date Taking? Authorizing Provider  ACAI BERRY PO Take by mouth daily.   Yes [provider]  aspirin 81 MG chewable tablet Chew 81 mg by mouth every morning.   Yes [provider]  atorvastatin (LIPITOR) 20 MG tablet Take 20 mg by mouth daily.   Yes [provider]  cholecalciferol (VITAMIN D) 1000 UNITS tablet Take 1,000 Units by mouth daily.   Yes [provider]  empagliflozin (JARDIANCE) 25 MG TABS tablet Take 25 mg by mouth daily.   Yes [provider]  metFORMIN (GLUCOPHAGE) 1000 MG tablet Take 1,000 mg by mouth 2 (two) times daily. 08/01/14 04/20/24 Yes [provider]  metoprolol succinate (TOPROL-XL) 25 MG 24 hr tablet Take 25 mg by mouth daily. 02/10/23  Yes [provider]  Multiple  Vitamin (MULTIVITAMIN WITH MINERALS) TABS Take 1 tablet by mouth daily.   Yes [provider]  pravastatin (PRAVACHOL) 40 MG tablet Take 40 mg by mouth daily. 08/01/14 04/21/23 Yes [provider]  sitaGLIPtin (JANUVIA) 100 MG tablet Take 100 mg by mouth daily.   Yes [provider]  valsartan (DIOVAN) 160 MG tablet Take 160 mg by mouth daily. 11/12/22  Yes [provider]  allopurinol (ZYLOPRIM) 300 MG tablet Take 300 mg by mouth daily. Patient not taking: Reported on 04/21/2023 08/01/14 08/01/15  [provider]      Allergies    Ibuprofen, Naproxen, and Penicillins    Review of Systems   Review of Systems  Physical Exam Updated Vital Signs BP (!) 189/99   Pulse 60   Temp 98.7 F (37.1 C) (Oral)   Resp 19   Ht 5\' 4"  (1.626 m)   Wt 93 kg   SpO2 98%   BMI 35.19 kg/m  Physical Exam Vitals and nursing note reviewed.  HENT:     Head: Normocephalic and atraumatic.  Eyes:     Pupils: Pupils are equal, round, and reactive to light.  Cardiovascular:     Rate and Rhythm: Normal rate and regular rhythm.  Pulmonary:     Effort: Pulmonary effort is normal.     Breath sounds: Normal breath sounds.  Abdominal:     Palpations: Abdomen is soft.     Tenderness: There is no abdominal tenderness.  Skin:  General: Skin is warm and dry.  Neurological:     Mental Status: She is alert.  Psychiatric:        Mood and Affect: Mood normal.     ED Results / Procedures / Treatments   Labs (all labs ordered are listed, but only abnormal results are displayed) Labs Reviewed  COMPREHENSIVE METABOLIC PANEL - Abnormal; Notable for the following components:      Result Value   Glucose, Bld 136 (*)    BUN 29 (*)    Creatinine, Ser 1.29 (*)    Albumin 3.1 (*)    GFR, Estimated 45 (*)    All other components within normal limits  CBC WITH DIFFERENTIAL/PLATELET - Abnormal; Notable for the following components:   MCV 78.0 (*)    MCH 24.5 (*)    RDW  16.6 (*)    All other components within normal limits  CBC - Abnormal; Notable for the following components:   Hemoglobin 11.6 (*)    HCT 35.4 (*)    MCV 77.3 (*)    MCH 25.3 (*)    RDW 16.7 (*)    All other components within normal limits  MAGNESIUM  HIV ANTIBODY (ROUTINE TESTING W REFLEX)  CREATININE, SERUM  BASIC METABOLIC PANEL  CBC WITH DIFFERENTIAL/PLATELET  HEMOGLOBIN A1C  TROPONIN I (HIGH SENSITIVITY)  TROPONIN I (HIGH SENSITIVITY)    EKG EKG Interpretation Date/Time:  Thursday April 21 2023 15:27:52 EDT Ventricular Rate:  92 PR Interval:  192 QRS Duration:  80 QT Interval:  356 QTC Calculation: 441 R Axis:   -58  Text Interpretation: Sinus rhythm Atrial premature complexes Consider left ventricular hypertrophy Inferior infarct, old Anterior Q waves, possibly due to LVH Confirmed by Estelle June [phone]) on 04/21/2023 11:39:18 PM  Radiology DG Chest Portable 1 View Result Date: 04/21/2023 CLINICAL DATA:  Chest pain. Increasing SVT and palpitations over the last 2 months. EXAM: PORTABLE CHEST 1 VIEW COMPARISON:  Radiographs 12/11/2013 and 03/08/2012. FINDINGS: 1656 hours. Lordotic positioning. The heart size and mediastinal contours are normal. The lungs are clear. There is no pleural effusion or pneumothorax. No acute osseous findings are identified. Telemetry leads overlie the chest. IMPRESSION: No evidence of acute cardiopulmonary process. Electronically Signed   By: Carey Bullocks M.D.   On: 04/21/2023 17:30    Procedures Procedures    Medications Ordered in ED Medications  enoxaparin (LOVENOX) injection 40 mg (has no administration in time range)  acetaminophen (TYLENOL) tablet 650 mg (has no administration in time range)    Or  acetaminophen (TYLENOL) suppository 650 mg (has no administration in time range)  senna-docusate (Senokot-S) tablet 1 tablet (has no administration in time range)  ondansetron (ZOFRAN) tablet 4 mg (has no administration in time  range)    Or  ondansetron (ZOFRAN) injection 4 mg (has no administration in time range)  atorvastatin (LIPITOR) tablet 20 mg (has no administration in time range)  aspirin chewable tablet 81 mg (has no administration in time range)  irbesartan (AVAPRO) tablet 150 mg (has no administration in time range)  insulin aspart (novoLOG) injection 0-15 Units (has no administration in time range)  insulin aspart (novoLOG) injection 0-5 Units (has no administration in time range)  metoprolol succinate (TOPROL-XL) 24 hr tablet 25 mg (has no administration in time range)  losartan (COZAAR) tablet 50 mg (50 mg Oral Given 04/21/23 2010)  metoprolol tartrate (LOPRESSOR) tablet 25 mg (25 mg Oral Given 04/21/23 2010)  sodium chloride 0.9 % bolus 1,000 mL (1,000 mLs  Intravenous New Bag/Given 04/21/23 2130)  hydrALAZINE (APRESOLINE) injection 10 mg (10 mg Intravenous Given 04/21/23 2334)    ED Course/ Medical Decision Making/ A&P Clinical Course as of 04/21/23 2339  Thu Apr 21, 2023  2100 Laboratory workup is unremarkable overall with no significant elevation in her high-sensitivity troponin.  Electrolytes look okay.  Creatinine slightly elevated.  No SVT on the monitor here.  Will provide IV fluids.  Discussed admitting hospitalist who accept spatient for admission to medicine [MP]    Clinical Course User Index [MP] Royanne Foots, DO                                 Medical Decision Making 68 year old female with history as above presenting given concern for symptomatic SVT.  Sent in from cardiology office.  SVT earlier today while in the office.  Normal sinus rhythm on monitor and she is asymptomatic now.  Concern for hypotension during episodes of SVT which are becoming more more frequent.  Did not take her valsartan or metoprolol today.  Will obtain cardiac workup including a sensitive troponin, CBC, metabolic panel, magnesium EKG and chest x-ray and continue to monitor on telemetry.  Will provide her with  her home doses of valsartan and metoprolol.  Amount and/or Complexity of Data Reviewed Labs: ordered. Radiology: ordered.  Risk Prescription drug management. Decision regarding hospitalization.           Final Clinical Impression(s) / ED Diagnoses Final diagnoses:  SVT (supraventricular tachycardia) [place])    Rx / DC Orders ED Discharge Orders     None         Royanne Foots, DO 04/21/23 2341

## 2023-04-22 ENCOUNTER — Other Ambulatory Visit (HOSPITAL_COMMUNITY)

## 2023-04-22 ENCOUNTER — Inpatient Hospital Stay (HOSPITAL_COMMUNITY)

## 2023-04-22 ENCOUNTER — Other Ambulatory Visit (HOSPITAL_COMMUNITY): Payer: Self-pay

## 2023-04-22 DIAGNOSIS — I471 Supraventricular tachycardia, unspecified: Secondary | ICD-10-CM | POA: Diagnosis not present

## 2023-04-22 DIAGNOSIS — R079 Chest pain, unspecified: Secondary | ICD-10-CM

## 2023-04-22 LAB — BASIC METABOLIC PANEL
Anion gap: 5 (ref 5–15)
BUN: 21 mg/dL (ref 8–23)
CO2: 23 mmol/L (ref 22–32)
Calcium: 8.4 mg/dL — ABNORMAL LOW (ref 8.9–10.3)
Chloride: 110 mmol/L (ref 98–111)
Creatinine, Ser: 1.2 mg/dL — ABNORMAL HIGH (ref 0.44–1.00)
GFR, Estimated: 49 mL/min — ABNORMAL LOW (ref >60)
Glucose, Bld: 115 mg/dL — ABNORMAL HIGH (ref 70–99)
Potassium: 3.5 mmol/L (ref 3.5–5.1)
Sodium: 138 mmol/L (ref 135–145)

## 2023-04-22 LAB — ECHOCARDIOGRAM COMPLETE
AR max vel: 2.2 cm2
AV Area VTI: 2.34 cm2
AV Area mean vel: 2.22 cm2
AV Mean grad: 3 mmHg
AV Peak grad: 6.6 mmHg
Ao pk vel: 1.28 m/s
Area-P 1/2: 2.95 cm2
Height: 64 in
S' Lateral: 3 cm
Weight: 3280 [oz_av]

## 2023-04-22 LAB — CBC WITH DIFFERENTIAL/PLATELET
Abs Immature Granulocytes: 0.02 10*3/uL (ref 0.00–0.07)
Basophils Absolute: 0 10*3/uL (ref 0.0–0.1)
Basophils Relative: 0 %
Eosinophils Absolute: 0.1 10*3/uL (ref 0.0–0.5)
Eosinophils Relative: 1 %
HCT: 36.1 % (ref 36.0–46.0)
Hemoglobin: 11.6 g/dL — ABNORMAL LOW (ref 12.0–15.0)
Immature Granulocytes: 0 %
Lymphocytes Relative: 25 %
Lymphs Abs: 2 10*3/uL (ref 0.7–4.0)
MCH: 24.7 pg — ABNORMAL LOW (ref 26.0–34.0)
MCHC: 32.1 g/dL (ref 30.0–36.0)
MCV: 76.8 fL — ABNORMAL LOW (ref 80.0–100.0)
Monocytes Absolute: 0.6 10*3/uL (ref 0.1–1.0)
Monocytes Relative: 8 %
Neutro Abs: 5.3 10*3/uL (ref 1.7–7.7)
Neutrophils Relative %: 66 %
Platelets: 254 10*3/uL (ref 150–400)
RBC: 4.7 MIL/uL (ref 3.87–5.11)
RDW: 16.7 % — ABNORMAL HIGH (ref 11.5–15.5)
WBC: 8.1 10*3/uL (ref 4.0–10.5)
nRBC: 0 % (ref 0.0–0.2)

## 2023-04-22 LAB — HEMOGLOBIN A1C
Hgb A1c MFr Bld: 6.1 % — ABNORMAL HIGH (ref 4.8–5.6)
Mean Plasma Glucose: 128.37 mg/dL

## 2023-04-22 LAB — TSH: TSH: 1.419 u[IU]/mL (ref 0.350–4.500)

## 2023-04-22 LAB — CBG MONITORING, ED
Glucose-Capillary: 135 mg/dL — ABNORMAL HIGH (ref 70–99)
Glucose-Capillary: 94 mg/dL (ref 70–99)
Glucose-Capillary: 108 mg/dL — ABNORMAL HIGH (ref 70–99)

## 2023-04-22 LAB — HIV ANTIBODY (ROUTINE TESTING W REFLEX): HIV Screen 4th Generation wRfx: NONREACTIVE

## 2023-04-22 MED ORDER — LOSARTAN POTASSIUM 50 MG PO TABS: 50.0000 mg | ORAL_TABLET | Freq: Once | ORAL | Status: DC

## 2023-04-22 MED ORDER — HYDRALAZINE HCL 25 MG PO TABS
50.0000 mg | ORAL_TABLET | ORAL | Status: AC
  Administered 2023-04-22: 50 mg via ORAL
  Filled 2023-04-22: qty 2

## 2023-04-22 MED ORDER — SODIUM CHLORIDE 0.9 % IV SOLN: INTRAVENOUS | Status: DC

## 2023-04-22 MED ORDER — LABETALOL HCL 5 MG/ML IV SOLN: 5.0000 mg | INTRAVENOUS | Status: DC | PRN

## 2023-04-22 MED ORDER — HYDRALAZINE HCL 50 MG PO TABS
50.0000 mg | ORAL_TABLET | Freq: Three times a day (TID) | ORAL | 0 refills | Status: DC
  Filled 2023-04-22: qty 90, 30d supply, fill #0

## 2023-04-22 MED ORDER — LABETALOL HCL 5 MG/ML IV SOLN
INTRAVENOUS | Status: AC
  Administered 2023-04-22: 5 mg via INTRAVENOUS
  Filled 2023-04-22: qty 4

## 2023-04-22 MED ORDER — METOPROLOL SUCCINATE ER 50 MG PO TB24
50.0000 mg | ORAL_TABLET | Freq: Every day | ORAL | 0 refills | Status: AC
  Filled 2023-04-22: qty 30, 30d supply, fill #0

## 2023-04-22 MED ORDER — HYDRALAZINE HCL 25 MG PO TABS
50.0000 mg | ORAL_TABLET | Freq: Three times a day (TID) | ORAL | Status: DC
  Administered 2023-04-22 (×2): 50 mg via ORAL
  Filled 2023-04-22 (×2): qty 2

## 2023-04-22 MED ORDER — HYDRALAZINE HCL 100 MG PO TABS
100.0000 mg | ORAL_TABLET | Freq: Three times a day (TID) | ORAL | 0 refills | Status: AC
  Filled 2023-04-22: qty 90, 30d supply, fill #0

## 2023-04-22 MED ORDER — HYDRALAZINE HCL 25 MG PO TABS: 100.0000 mg | ORAL_TABLET | Freq: Three times a day (TID) | ORAL | Status: DC

## 2023-04-22 MED ORDER — METOPROLOL SUCCINATE ER 25 MG PO TB24
50.0000 mg | ORAL_TABLET | Freq: Every day | ORAL | Status: DC
Start: 2023-04-22 — End: 2023-04-22
  Administered 2023-04-22: 50 mg via ORAL
  Filled 2023-04-22: qty 2

## 2023-04-22 NOTE — Discharge Summary (Addendum)
 Physician Discharge Summary  Katelyn [name] NWG:[id number] DOB: May 30, 1955 DOA: 04/21/2023  PCP: Pcp, No  Admit date: 04/21/2023 Discharge date: 04/22/2023 Recommendations for Outpatient Follow-up:  Follow up with PCP in 1 weeks-call for appointment Please obtain BMP/CBC in one week  Discharge Dispo: Home Discharge Condition: Stable Code Status:   Code Status: Full Code Diet recommendation:  Diet Order             Diet heart healthy/carb modified Room service appropriate? Yes; Fluid consistency: Thin  Diet effective now                    Brief/Interim Summary: 68 yof w/ history of of HTN, osteoarthritis, OSA not on CPAP,T2DM, gout, HLD who was sent from Dr. Annitta Jersey office with symptomatic SVTs.  In the ED creatinine 1.29 GFR 45, stable CBC hxTrop: 5> 11..  Patient was admitted for further management metoprolol was increased cardiology was consulted-recommended discharge home on metoprolol he will arrange outpatient follow-up.  If echo unremarkable patient will be discharged home Labs STABLE, BP 140s-160s, afebrile not hypoxic and doing well   Discharge Diagnoses:  Principal Problem:   SVT (supraventricular tachycardia) (HCC) Active Problems:   Essential hypertension, benign   OSA (obstructive sleep apnea)  SVT symptomatic: Heart rate improved metoprolol dose has been increased to 50 mg.  Plans for discharge home today after echocardiogram.  Discussed with Dr. Sharyn Lull TTE STABE>>"Left ventricular ejection fraction, by estimation, is 55 to 60%. Left ventricular ejection fraction by 3D volume is 57 %. The left ventricle has normal function. The left ventricle has no regional wall motion abnormalities. There is moderate  concentric left ventricular hypertrophy. Left ventricular diastolic parameters are consistent with Grade I diastolic dysfunction (impaired relaxation). The average left ventricular global longitudinal strain is -17.3 %. The global longitudinal strain is mildy  abnormal with septal strain abnormality.  2. Right ventricular systolic function is normal. The right ventricular size is normal. Tricuspid regurgitation signal is inadequate for assessing PA pressure  Essential hypertension: BP fairly stable.  Uncontrolled on admission.  Continue metoprolol at 50 mg hydralazine upped to 100 mg. Bp now better at 160s and she wants to go home and follow up with her cardiology as outpatient She does not want to stay any longer-  AKI: Baseline creatinine 0.7-0.8.  1.29 on admission, received IV fluids, advised to continue aggressive oral hydration at home and follow-up BMP next week   OSA: does not use CPAP  HLD: Continue statin  T2DM: A1c stable less than 7, continue home meds  Consults: Cardio on phone Subjective: aaox3  Discharge Exam: Vitals:   04/22/23 1405 04/22/23 1536  BP: (!) 217/85 (!) 165/90  Pulse: 69   Resp: 17   Temp: 98.1 F (36.7 C)   SpO2: 97%    General: Pt is alert, awake, not in acute distress Cardiovascular: RRR, S1/S2 +, no rubs, no gallops Respiratory: CTA bilaterally, no wheezing, no rhonchi Abdominal: Soft, NT, ND, bowel sounds + Extremities: no edema, no cyanosis  Discharge Instructions  Discharge Instructions     Discharge instructions   Complete by: As directed    Please call call MD or return to ER for similar or worsening recurring problem that brought you to hospital or if any fever,nausea/vomiting,abdominal pain, uncontrolled pain, chest pain,  shortness of breath or any other alarming symptoms.  Please follow-up your doctor as instructed in a week time and call the office for appointment.  Please avoid alcohol, smoking, or any  other illicit substance and maintain healthy habits including taking your regular medications as prescribed.  You were cared for by a hospitalist during your hospital stay. If you have any questions about your discharge medications or the care you received while you were in the  hospital after you are discharged, you can call the unit and ask to speak with the hospitalist on call if the hospitalist that took care of you is not available.  Once you are discharged, your primary care physician will handle any further medical issues. Please note that NO REFILLS for any discharge medications will be authorized once you are discharged, as it is imperative that you return to your primary care physician (or establish a relationship with a primary care physician if you do not have one) for your aftercare needs so that they can reassess your need for medications and monitor your lab values   Increase activity slowly   Complete by: As directed       Allergies as of 04/22/2023       Reactions   Ibuprofen Rash   Naproxen Rash   Penicillins Rash   Childhood allergy         Medication List     STOP taking these medications    valsartan 160 MG tablet Commonly known as: DIOVAN       TAKE these medications    ACAI BERRY PO Take by mouth daily.   aspirin 81 MG chewable tablet Chew 81 mg by mouth every morning.   atorvastatin 20 MG tablet Commonly known as: LIPITOR Take 20 mg by mouth daily.   cholecalciferol 1000 units tablet Commonly known as: VITAMIN D Take 1,000 Units by mouth daily.   Glucophage 1000 MG tablet Generic drug: metFORMIN Take 1,000 mg by mouth 2 (two) times daily.   hydrALAZINE 100 MG tablet Commonly known as: APRESOLINE Take 1 tablet (100 mg total) by mouth every 8 (eight) hours.   Jardiance 25 MG Tabs tablet Generic drug: empagliflozin Take 25 mg by mouth daily.   metoprolol succinate 50 MG 24 hr tablet Commonly known as: TOPROL-XL Take 1 tablet (50 mg total) by mouth daily. Start taking on: April 23, 2023 What changed:  medication strength how much to take   multivitamin with minerals Tabs tablet Take 1 tablet by mouth daily.   Pravachol 40 MG tablet Generic drug: pravastatin Take 40 mg by mouth daily.   sitaGLIPtin  100 MG tablet Commonly known as: JANUVIA Take 100 mg by mouth daily.   Zyloprim 300 MG tablet Generic drug: allopurinol Take 300 mg by mouth daily.        Follow-up Information     Rinaldo Cloud, MD Follow up in 1 week(s).   Specialty: Cardiology Contact information: [address] [place] [address] [phone]                Allergies  Allergen Reactions   Ibuprofen Rash   Naproxen Rash   Penicillins Rash    Childhood allergy     The results of significant diagnostics from this hospitalization (including imaging, microbiology, ancillary and laboratory) are listed below for reference.    Microbiology: No results found for this or any previous visit (from the past 240 hours).  Procedures/Studies: ECHOCARDIOGRAM COMPLETE Result Date: 04/22/2023    ECHOCARDIOGRAM REPORT   Patient Name:   DMIYA MALPHRUS Date of Exam: 04/22/2023 Medical Rec #:  [id number]          Height:  64.0 in Accession #:    [id number]         Weight:       205.0 lb Date of Birth:  Mar 13, 1955           BSA:          1.977 m Patient Age:    68 years           BP:           176/99 mmHg Patient Gender: F                  HR:           62 bpm. Exam Location:  Inpatient Procedure: 2D Echo, 3D Echo, Cardiac Doppler, Color Doppler and Strain Analysis            (Both Spectral and Color Flow Doppler were utilized during            procedure). Indications:    Chest Pain  History:        Patient has no prior history of Echocardiogram examinations.                 Risk Factors:Hypertension, Diabetes and Sleep Apnea.  Sonographer:    Karma Ganja Referring Phys: [id number] Talaysha Freeberg  Sonographer Comments: Global longitudinal strain was attempted. IMPRESSIONS  1. Left ventricular ejection fraction, by estimation, is 55 to 60%. Left ventricular ejection fraction by 3D volume is 57 %. The left ventricle has normal function. The left ventricle has no regional wall motion abnormalities. There is  moderate concentric left ventricular hypertrophy. Left ventricular diastolic parameters are consistent with Grade I diastolic dysfunction (impaired relaxation). The average left ventricular global longitudinal strain is -17.3 %. The global longitudinal strain is mildy abnormal with septal strain abnormality.  2. Right ventricular systolic function is normal. The right ventricular size is normal. Tricuspid regurgitation signal is inadequate for assessing PA pressure.  3. The mitral valve is grossly normal. Trivial mitral valve regurgitation.  4. The aortic valve is tricuspid. Aortic valve regurgitation is not visualized. No aortic stenosis is present.  5. The inferior vena cava is normal in size with greater than 50% respiratory variability, suggesting right atrial pressure of 3 mmHg. Comparison(s): No prior Echocardiogram. FINDINGS  Left Ventricle: Left ventricular ejection fraction, by estimation, is 55 to 60%. Left ventricular ejection fraction by 3D volume is 57 %. The left ventricle has normal function. The left ventricle has no regional wall motion abnormalities. The average left ventricular global longitudinal strain is -17.3 %. Strain was performed and the global longitudinal strain is abnormal. The left ventricular internal cavity size was normal in size. There is moderate concentric left ventricular hypertrophy. Left ventricular diastolic parameters are consistent with Grade I diastolic dysfunction (impaired relaxation). Indeterminate filling pressures. Right Ventricle: The right ventricular size is normal. No increase in right ventricular wall thickness. Right ventricular systolic function is normal. Tricuspid regurgitation signal is inadequate for assessing PA pressure. Left Atrium: Left atrial size was normal in size. Right Atrium: Right atrial size was normal in size. Pericardium: There is no evidence of pericardial effusion. Mitral Valve: The mitral valve is grossly normal. Trivial mitral valve  regurgitation. Tricuspid Valve: The tricuspid valve is normal in structure. Tricuspid valve regurgitation is not demonstrated. Aortic Valve: The aortic valve is tricuspid. Aortic valve regurgitation is not visualized. No aortic stenosis is present. Aortic valve mean gradient measures 3.0 mmHg. Aortic valve peak gradient measures 6.6 mmHg. Aortic valve area, by  VTI measures 2.34 cm. Pulmonic Valve: The pulmonic valve was grossly normal. Pulmonic valve regurgitation is trivial. Aorta: The aortic root and ascending aorta are structurally normal, with no evidence of dilitation. Venous: The inferior vena cava is normal in size with greater than 50% respiratory variability, suggesting right atrial pressure of 3 mmHg. IAS/Shunts: No atrial level shunt detected by color flow Doppler. Additional Comments: 3D was performed not requiring image post processing on an independent workstation and was normal.  LEFT VENTRICLE PLAX 2D LVIDd:         4.60 cm         Diastology LVIDs:         3.00 cm         LV e' medial:    4.24 cm/s LV PW:         1.10 cm         LV E/e' medial:  16.7 LV IVS:        1.30 cm         LV e' lateral:   5.11 cm/s LVOT diam:     2.00 cm         LV E/e' lateral: 13.9 LV SV:         64 LV SV Index:   32              2D Longitudinal LVOT Area:     3.14 cm        Strain                                2D Strain GLS   -15.5 %                                (A4C):                                2D Strain GLS   -17.0 %                                (A3C):                                2D Strain GLS   -19.5 %                                (A2C):                                2D Strain GLS   -17.3 %                                Avg:                                 3D Volume EF                                LV 3D  EF:    Left                                             ventricul                                             ar                                             ejection                                              fraction                                             by 3D                                             volume is                                             57 %.                                 3D Volume EF:                                3D EF:        57 %                                LV EDV:       134 ml                                LV ESV:       57 ml                                LV SV:        77 ml RIGHT VENTRICLE             IVC RV Basal diam:  3.30 cm     IVC diam: 1.55 cm RV S prime:     13.10 cm/s TAPSE (M-mode): 2.7 cm LEFT ATRIUM             Index        RIGHT ATRIUM  Index LA diam:        4.40 cm 2.23 cm/m   RA Area:     12.00 cm LA Vol (A2C):   52.9 ml 26.76 ml/m  RA Volume:   27.30 ml  13.81 ml/m LA Vol (A4C):   40.8 ml 20.64 ml/m LA Biplane Vol: 47.0 ml 23.77 ml/m  AORTIC VALVE AV Area (Vmax):    2.20 cm AV Area (Vmean):   2.22 cm AV Area (VTI):     2.34 cm AV Vmax:           128.00 cm/s AV Vmean:          81.400 cm/s AV VTI:            0.273 m AV Peak Grad:      6.6 mmHg AV Mean Grad:      3.0 mmHg LVOT Vmax:         89.60 cm/s LVOT Vmean:        57.500 cm/s LVOT VTI:          0.203 m LVOT/AV VTI ratio: 0.74  AORTA Ao Root diam: 2.80 cm Ao Asc diam:  2.70 cm MITRAL VALVE MV Area (PHT): 2.95 cm     SHUNTS MV Decel Time: 257 msec     Systemic VTI:  0.20 m MV E velocity: 70.90 cm/s   Systemic Diam: 2.00 cm MV A velocity: 106.00 cm/s MV E/A ratio:  0.67 Zoila Shutter MD Electronically signed by Zoila Shutter MD Signature Date/Time: 04/22/2023/1:03:03 PM    Final    DG Chest Portable 1 View Result Date: 04/21/2023 CLINICAL DATA:  Chest pain. Increasing SVT and palpitations over the last 2 months. EXAM: PORTABLE CHEST 1 VIEW COMPARISON:  Radiographs 12/11/2013 and 03/08/2012. FINDINGS: 1656 hours. Lordotic positioning. The heart size and mediastinal contours are normal. The lungs are clear. There is no pleural effusion or pneumothorax. No acute osseous findings are  identified. Telemetry leads overlie the chest. IMPRESSION: No evidence of acute cardiopulmonary process. Electronically Signed   By: Carey Bullocks M.D.   On: 04/21/2023 17:30   DG Foot 2 Views Left Result Date: 04/19/2023 Please see detailed radiograph report in office note.   Labs: BNP (last 3 results) No results for input(s): "BNP" in the last 8760 hours. Basic Metabolic Panel: Recent Labs  Lab 04/21/23 1700 04/21/23 2150 04/22/23 0418  NA 137  --  138  K 3.8  --  3.5  CL 105  --  110  CO2 24  --  23  GLUCOSE 136*  --  115*  BUN 29*  --  21  CREATININE 1.29* 1.23* 1.20*  CALCIUM 8.9  --  8.4*  MG 2.2  --   --    Liver Function Tests: Recent Labs  Lab 04/21/23 1700  AST 19  ALT 18  ALKPHOS 64  BILITOT 0.7  PROT 6.5  ALBUMIN 3.1*   No results for input(s): "LIPASE", "AMYLASE" in the last 168 hours. No results for input(s): "AMMONIA" in the last 168 hours. CBC: Recent Labs  Lab 04/21/23 1700 04/21/23 2150 04/22/23 0418  WBC 6.8 6.6 8.1  NEUTROABS 3.8  --  5.3  HGB 12.0 11.6* 11.6*  HCT 38.2 35.4* 36.1  MCV 78.0* 77.3* 76.8*  PLT 231 251 254   Cardiac Enzymes: No results for input(s): "CKTOTAL", "CKMB", "CKMBINDEX", "TROPONINI" in the last 168 hours. BNP: Invalid input(s): "POCBNP" CBG: Recent Labs  Lab 04/22/23 0203 04/22/23 0808 04/22/23 1115  GLUCAP 135* 94  108*   D-Dimer No results for input(s): "DDIMER" in the last 72 hours. Hgb A1c Recent Labs    04/21/23 2150  HGBA1C 6.1*   Lipid Profile No results for input(s): "CHOL", "HDL", "LDLCALC", "TRIG", "CHOLHDL", "LDLDIRECT" in the last 72 hours. Thyroid function studies Recent Labs    04/22/23 0418  TSH 1.419   Anemia work up No results for input(s): "VITAMINB12", "FOLATE", "FERRITIN", "TIBC", "IRON", "RETICCTPCT" in the last 72 hours. Urinalysis    Component Value Date/Time   COLORURINE YELLOW 07/31/2013 2145   APPEARANCEUR CLEAR 07/31/2013 2145   LABSPEC 1.023 07/31/2013 2145    PHURINE 5.5 07/31/2013 2145   GLUCOSEU NEGATIVE 07/31/2013 2145   HGBUR NEGATIVE 07/31/2013 2145   BILIRUBINUR NEGATIVE 07/31/2013 2145   BILIRUBINUR neg 07/25/2013 1202   KETONESUR NEGATIVE 07/31/2013 2145   PROTEINUR NEGATIVE 07/31/2013 2145   UROBILINOGEN 0.2 07/31/2013 2145   NITRITE NEGATIVE 07/31/2013 2145   LEUKOCYTESUR MODERATE (A) 07/31/2013 2145   Sepsis Labs Recent Labs  Lab 04/21/23 1700 04/21/23 2150 04/22/23 0418  WBC 6.8 6.6 8.1   Microbiology No results found for this or any previous visit (from the past 240 hours).   Time coordinating discharge: 25 minutes  SIGNED: Lanae Boast, MD  [facility] 04/22/2023, 3:58 PM  If 7PM-7AM, please contact night-coverage [email or web address]

## 2023-04-22 NOTE — Care Management CC44 (Signed)
 Condition Code 44 Documentation Completed  Patient Details  Name: Katelyn [name] MRN: [id number] Date of Birth: [birth year]   Condition Code 44 given:  Yes Patient signature on Condition Code 44 notice:  Yes Documentation of 2 MD's agreement:  Yes Code 44 added to claim:  Yes    Kizzie Ide Rena Sweeden, RN 04/22/2023, 2:17 PM

## 2023-04-22 NOTE — ED Notes (Signed)
 Pt ambulated with steady gait to bathroom. This RN provided patient with new and clean brief.

## 2023-04-22 NOTE — Care Management Obs Status (Signed)
 MEDICARE OBSERVATION STATUS NOTIFICATION   Patient Details  Name: Katelyn [name] MRN: [id number] Date of Birth: 1955/05/26   Medicare Observation Status Notification Given:  No    Kizzie Ide Jocelin Schuelke, RN 04/22/2023, 2:16 PM

## 2023-04-22 NOTE — Progress Notes (Signed)
  Echocardiogram 2D Echocardiogram has been performed.  Katelyn [name] Keilly Fatula 04/22/2023, 10:48 AM

## 2023-04-25 NOTE — Telephone Encounter (Signed)
 Pt called to get scheduled for surgery and is now scheduled for 05/23/23 with Dr Allena Katz.

## 2023-05-05 ENCOUNTER — Telehealth: Payer: Self-pay | Admitting: Podiatry

## 2023-05-05 NOTE — Telephone Encounter (Signed)
 DOS: 05/23/23  (L) 2ND METATARSAL FLOATING OSTEOTOMY  (16109)  EFFECTIVE DATE: 02/02/2023 - 02/01/2024   DEDUCTIBLE: $0.00  REMAINING:  $0.00  CO INSURANCE : 20%  PER [employer] WEBSITE NO PRIOR AUTH IS REQ FOR CPT CODE 60454  REF# [id number]

## 2023-05-26 ENCOUNTER — Other Ambulatory Visit: Payer: Self-pay | Admitting: Family Medicine

## 2023-05-26 DIAGNOSIS — Z1231 Encounter for screening mammogram for malignant neoplasm of breast: Secondary | ICD-10-CM

## 2023-05-31 ENCOUNTER — Encounter: Admitting: Podiatry

## 2023-06-09 ENCOUNTER — Encounter (HOSPITAL_COMMUNITY): Payer: Self-pay

## 2023-06-10 ENCOUNTER — Other Ambulatory Visit: Payer: Self-pay | Admitting: Medical Genetics

## 2023-06-14 ENCOUNTER — Encounter: Admitting: Podiatry

## 2023-06-16 ENCOUNTER — Ambulatory Visit
Admission: RE | Admit: 2023-06-16 | Discharge: 2023-06-16 | Disposition: A | Discharge status: Home / Self Care | Source: Ambulatory Visit | Attending: Family Medicine | Admitting: Family Medicine

## 2023-06-16 DIAGNOSIS — Z1231 Encounter for screening mammogram for malignant neoplasm of breast: Secondary | ICD-10-CM | POA: Insufficient documentation

## 2023-06-20 ENCOUNTER — Inpatient Hospital Stay
Admission: RE | Admit: 2023-06-20 | Discharge: 2023-06-20 | Disposition: A | Discharge status: Home / Self Care | Payer: Self-pay | Source: Ambulatory Visit | Attending: Family Medicine | Admitting: Family Medicine

## 2023-06-20 ENCOUNTER — Other Ambulatory Visit: Payer: Self-pay | Admitting: *Deleted

## 2023-06-20 DIAGNOSIS — Z1231 Encounter for screening mammogram for malignant neoplasm of breast: Secondary | ICD-10-CM

## 2023-06-29 ENCOUNTER — Other Ambulatory Visit: Payer: Self-pay | Admitting: Family Medicine

## 2023-06-29 DIAGNOSIS — R928 Other abnormal and inconclusive findings on diagnostic imaging of breast: Secondary | ICD-10-CM

## 2023-06-30 ENCOUNTER — Ambulatory Visit
Admission: RE | Admit: 2023-06-30 | Discharge: 2023-06-30 | Disposition: A | Discharge status: Home / Self Care | Source: Ambulatory Visit | Attending: Family Medicine | Admitting: Family Medicine

## 2023-06-30 DIAGNOSIS — R928 Other abnormal and inconclusive findings on diagnostic imaging of breast: Secondary | ICD-10-CM | POA: Diagnosis present

## 2023-07-07 ENCOUNTER — Other Ambulatory Visit: Payer: Self-pay | Admitting: Family Medicine

## 2023-07-07 DIAGNOSIS — R928 Other abnormal and inconclusive findings on diagnostic imaging of breast: Secondary | ICD-10-CM

## 2023-07-11 ENCOUNTER — Ambulatory Visit

## 2023-07-13 ENCOUNTER — Ambulatory Visit
Admission: RE | Admit: 2023-07-13 | Discharge: 2023-07-13 | Disposition: A | Discharge status: Home / Self Care | Source: Ambulatory Visit | Attending: Family Medicine | Admitting: Family Medicine

## 2023-07-13 DIAGNOSIS — R928 Other abnormal and inconclusive findings on diagnostic imaging of breast: Secondary | ICD-10-CM | POA: Insufficient documentation

## 2023-07-13 HISTORY — PX: BREAST BIOPSY: SHX20

## 2023-07-13 MED ORDER — LIDOCAINE 1 % OPTIME INJ - NO CHARGE
5.0000 mL | Freq: Once | INTRAMUSCULAR | Status: AC
  Administered 2023-07-13: 5 mL
  Filled 2023-07-13: qty 6

## 2023-07-13 MED ORDER — LIDOCAINE-EPINEPHRINE 1 %-1:100000 IJ SOLN
20.0000 mL | Freq: Once | INTRAMUSCULAR | Status: AC
  Administered 2023-07-13: 20 mL

## 2023-07-18 ENCOUNTER — Ambulatory Visit

## 2023-07-25 ENCOUNTER — Ambulatory Visit

## 2023-07-25 ENCOUNTER — Ambulatory Visit: Admitting: Podiatry

## 2023-08-11 ENCOUNTER — Encounter: Attending: Family Medicine | Admitting: Dietician

## 2023-08-11 ENCOUNTER — Encounter: Payer: Self-pay | Admitting: Dietician

## 2023-08-11 DIAGNOSIS — E1159 Type 2 diabetes mellitus with other circulatory complications: Secondary | ICD-10-CM | POA: Insufficient documentation

## 2023-08-11 DIAGNOSIS — E119 Type 2 diabetes mellitus without complications: Secondary | ICD-10-CM

## 2023-08-11 DIAGNOSIS — Z713 Dietary counseling and surveillance: Secondary | ICD-10-CM | POA: Insufficient documentation

## 2023-08-11 NOTE — Progress Notes (Signed)
 Patient was seen on 08/11/2023 for the first of a series of three diabetes self-management courses at the [facility].  Patient Education Plan per assessed needs and concerns is to attend three course education program for Diabetes Self Management Education.  A1C was 6.1% on 04/21/2023.  The following learning objectives were met by the patient during this class: Describe diabetes, types of diabetes and pathophysiology State some common risk factors for diabetes Defines the role of glucose and insulin  Describe the relationship between diabetes and cardiovascular and other risks State the members of the Healthcare Team States the rationale for glucose monitoring and when to test State their individual Target Range State the importance of logging glucose readings and how to interpret the readings Identifies A1C target Explain the correlation between A1c and eAG values State symptoms and treatment of high blood glucose and low blood glucose Explain proper technique for glucose testing and identify proper sharps disposal  Handouts given during class include: How to Thrive:  A Guide for Your Journey with Diabetes by the ADA Meal Plan Card and carbohydrate content list Dietary intake form Low Sodium Flavoring Tips Types of Fats Dining Out Label reading Snack list The diabetes portion plate Diabetes Resources A1c to eAG Conversion Chart Blood Glucose Log Diabetes Recommended Care Schedule Support Group Diabetes Success Plan Core Class Satisfaction Survey   Follow-Up Plan: Attend core 2

## 2023-08-18 ENCOUNTER — Encounter: Admitting: Dietician

## 2023-08-18 ENCOUNTER — Encounter: Payer: Self-pay | Admitting: Dietician

## 2023-08-18 DIAGNOSIS — E1159 Type 2 diabetes mellitus with other circulatory complications: Secondary | ICD-10-CM | POA: Diagnosis not present

## 2023-08-18 DIAGNOSIS — E119 Type 2 diabetes mellitus without complications: Secondary | ICD-10-CM

## 2023-08-18 NOTE — Progress Notes (Signed)
 Patient was seen on 08/18/2023 for the second of a series of three diabetes self-management courses at the [facility]. The following learning objectives were met by the patient during this class:  Describe the role of different macronutrients on glucose Explain how carbohydrates affect blood glucose State what foods contain the most carbohydrates Demonstrate carbohydrate counting Demonstrate how to read Nutrition Facts food label Describe effects of various fats on heart health Describe the importance of good nutrition for health and healthy eating strategies Describe techniques for managing your shopping, cooking and meal planning List strategies to follow meal plan when dining out Describe the effects of alcohol on glucose and how to use it safely  Goals:  Follow Diabetes Meal Plan as instructed  Aim to spread carbs evenly throughout the day  Aim for 3 meals per day and snacks as needed Include lean protein foods to meals/snacks  Monitor glucose levels as instructed by your doctor   Follow-Up Plan: Attend Core 3 Work towards following your personal food plan.

## 2023-08-25 ENCOUNTER — Encounter: Admitting: Dietician

## 2023-08-25 ENCOUNTER — Encounter: Payer: Self-pay | Admitting: Dietician

## 2023-08-25 DIAGNOSIS — E119 Type 2 diabetes mellitus without complications: Secondary | ICD-10-CM

## 2023-08-25 DIAGNOSIS — E1159 Type 2 diabetes mellitus with other circulatory complications: Secondary | ICD-10-CM | POA: Diagnosis not present

## 2023-08-25 NOTE — Progress Notes (Signed)
 Patient was seen on 08/25/2023 for the third of a series of three diabetes self-management courses at the [facility].   State the amount of activity recommended for healthy living Describe activities suitable for individual needs Identify ways to regularly incorporate activity into daily life Identify barriers to activity and ways to over come these barriers Identify diabetes medications being personally used and their primary action for lowering glucose and possible side effects Describe role of stress on blood glucose and develop strategies to address psychosocial issues Identify diabetes complications and ways to prevent them Explain how to manage diabetes during illness Evaluate success in meeting personal goal Establish 2-3 goals that they will plan to diligently work on  Goals:  I will count my carb choices at most meals and snacks I will be active 5 times a week I will take my diabetes medications as scheduled I will eat less unhealthy fats      Plan:  Follow up with RD after 3 months Attend Support Group as desired

## 2023-10-07 ENCOUNTER — Other Ambulatory Visit
Admission: RE | Admit: 2023-10-07 | Discharge: 2023-10-07 | Disposition: A | Discharge status: Home / Self Care | Payer: Self-pay | Source: Ambulatory Visit | Attending: Medical Genetics | Admitting: Medical Genetics

## 2023-10-10 ENCOUNTER — Ambulatory Visit: Admitting: Dietician

## 2023-10-17 LAB — GENECONNECT MOLECULAR SCREEN: Genetic Analysis Overall Interpretation: NEGATIVE

## 2023-10-19 ENCOUNTER — Encounter: Payer: Self-pay | Admitting: Dietician

## 2023-10-19 ENCOUNTER — Encounter: Attending: Family Medicine | Admitting: Dietician

## 2023-10-19 VITALS — Ht 62.0 in | Wt 194.8 lb

## 2023-10-19 DIAGNOSIS — E119 Type 2 diabetes mellitus without complications: Secondary | ICD-10-CM | POA: Insufficient documentation

## 2023-10-19 NOTE — Patient Instructions (Addendum)
 Keep drinking plenty of fluids, around 64oz or more daily.  Eat large portions of low-carb veggies, ideally 1 cup or more. Eat medium portions of starchy veggies including corn, peas, beans, and potatoes. Great job working on portion control of [employer]! Keep it up!

## 2024-01-18 NOTE — Congregational Nurse Program (Signed)
°  Dept: [phone]   Congregational Nurse Program Note  Date of Encounter: 01/18/2024  Clinic visit to take blood pressure, BP 128/74, discussed normal blood pressure levels and answered questions regarding prediabetes. Past Medical History: Past Medical History:  Diagnosis Date   Borderline diabetes    Heart murmur    Hypertension    Obesity    Osteoarthritis    Positive PPD, treated    INH x 9 months    Seasonal allergies    Sleep apnea    no CPAP    Encounter Details:  Community Questionnaire - 01/18/24 1544       Questionnaire   Ask client: Do you give verbal consent for me to treat you today? Yes    Student Assistance N/A    Location Patient Served  [facility]    Encounter Setting CN site    Population Status Unknown   Has own apartment at [employer];Medicare    Insurance/Financial Assistance Referral N/A    Medication N/A    Medical Provider Yes    Screening Referrals Made N/A    Medical Referrals Made N/A    Medical Appointment Completed N/A    CNP Interventions Advocate/Support;Counsel;Spiritual Care;Educate    Screenings CN Performed Blood Pressure    ED Visit Averted N/A    Life-Saving Intervention Made N/A

## 2024-02-01 LAB — GLUCOSE, POCT (MANUAL RESULT ENTRY): POC Glucose: 101 mg/dL — AB (ref 70–99)

## 2024-02-07 NOTE — Congregational Nurse Program (Signed)
" °  Dept: [phone]   Congregational Nurse Program Note  Date of Encounter: 02/01/2024  Clinic visit to check blood pressure, BP 130/80, pulse 68 and regular, O2 Sat 97%.  Reviewed normal blood pressure and pulse levels with resident and steps to maintain normal blood pressure.  Past Medical History: Past Medical History:  Diagnosis Date   Borderline diabetes    Heart murmur    Hypertension    Obesity    Osteoarthritis    Positive PPD, treated    INH x 9 months    Seasonal allergies    Sleep apnea    no CPAP    Encounter Details:  Community Questionnaire - 02/01/24 1330       Questionnaire   Ask client: Do you give verbal consent for me to treat you today? Yes    Student Assistance N/A    Location Patient Served  [facility]    Encounter Setting CN site    Population Status Unknown   Has own apartment at [employer];Medicare    Insurance/Financial Assistance Referral N/A    Medication N/A    Medical Provider Yes    Medical Referrals Made NA    Medical Appointment Completed N/A    Screenings CN Performed (remember to also record results) Blood Pressure;Blood Glucose    CNP Interventions Diabetes Education;Advocate/Support;Health Counseling    ED Visit Averted N/A      Questionnaire   Life-Saving Intervention Made N/A            "

## 2024-02-08 NOTE — Congregational Nurse Program (Signed)
" °  Dept: [phone]   Congregational Nurse Program Note  Date of Encounter: 02/08/2024  Clinic visit to check blood pressure, BP 110/68, pulse 60 and regular, O2 Sat 99%.  Weight 190 Lbs, discussed weight loss strategies.  Past Medical History: Past Medical History:  Diagnosis Date   Borderline diabetes    Heart murmur    Hypertension    Obesity    Osteoarthritis    Positive PPD, treated    INH x 9 months    Seasonal allergies    Sleep apnea    no CPAP    Encounter Details:  Community Questionnaire - 02/08/24 1410       Questionnaire   Ask client: Do you give verbal consent for me to treat you today? Yes    Student Assistance N/A    Location Patient Served  [facility]    Encounter Setting CN site    Population Status Unknown   Has own apartment at [employer];Medicare    Insurance/Financial Assistance Referral N/A    Medication N/A    Medical Provider Yes    Medical Referrals Made NA    Medical Appointment Completed N/A    Screenings CN Performed (remember to also record results) Blood Pressure;Weight    CNP Interventions Diabetes Education;Advocate/Support;Health Counseling;Hypertension Education    ED Visit Averted N/A      Questionnaire   Life-Saving Intervention Made N/A            "

## 2024-02-15 LAB — GLUCOSE, POCT (MANUAL RESULT ENTRY): POC Glucose: 99 mg/dL (ref 70–99)

## 2024-02-15 NOTE — Congregational Nurse Program (Signed)
" °  Dept: [phone]   Congregational Nurse Program Note  Date of Encounter: 02/15/2024  Clinic visit to check blood pressure, BP 120/70, pulse 60 and regular, O2 Sat 98%, discussed dietary choices to keep blood pressure within normal limits. Past Medical History: Past Medical History:  Diagnosis Date   Borderline diabetes    Heart murmur    Hypertension    Obesity    Osteoarthritis    Positive PPD, treated    INH x 9 months    Seasonal allergies    Sleep apnea    no CPAP    Encounter Details:     "

## 2024-02-22 LAB — GLUCOSE, POCT (MANUAL RESULT ENTRY): POC Glucose: 114 mg/dL — AB (ref 70–99)

## 2024-02-22 NOTE — Congregational Nurse Program (Signed)
" °  Dept: [phone]   Congregational Nurse Program Note  Date of Encounter: 02/22/2024  Clinic visit to check blood pressure, weight and blood glucose.  BP 116/84, pulse 58 and regular, O2 Sat 98%, weight 189 Lbs.  Blood glucose 114 PC lunch, has lost 1 lb in past week.  Educated regarding losing 1 - 2 Lbs weekly to [employer] [facility]. Past Medical History: Past Medical History:  Diagnosis Date   Borderline diabetes    Heart murmur    Hypertension    Obesity    Osteoarthritis    Positive PPD, treated    INH x 9 months    Seasonal allergies    Sleep apnea    no CPAP    Encounter Details:  Community Questionnaire - 02/22/24 1507       Questionnaire   Ask client: Do you give verbal consent for me to treat you today? Yes    Student Assistance N/A    Location Patient Served  [facility]    Encounter Setting CN site    Population Status Unknown   Has own apartment at [employer];Medicare    Insurance/Financial Assistance Referral N/A    Medication N/A    Medical Provider Yes    Medical Referrals Made NA    Medical Appointment Completed N/A    Screenings CN Performed (remember to also record results) Blood Pressure;Weight;Blood Glucose    CNP Interventions Diabetes Education;Advocate/Support;Health Counseling    ED Visit Averted N/A      Questionnaire   Life-Saving Intervention Made N/A            "
